# Patient Record
Sex: Female | Born: 1935 | Race: White | Hispanic: No | Marital: Single | Smoking: Never smoker
Health system: Southern US, Community
[De-identification: ages and names within clinical notes are randomized; demographics above are authoritative.]

## PROBLEM LIST (undated history)

## (undated) DIAGNOSIS — S72009A Fracture of unspecified part of neck of unspecified femur, initial encounter for closed fracture: Secondary | ICD-10-CM

## (undated) DIAGNOSIS — L0390 Cellulitis, unspecified: Secondary | ICD-10-CM

## (undated) DIAGNOSIS — I82402 Acute embolism and thrombosis of unspecified deep veins of left lower extremity: Secondary | ICD-10-CM

## (undated) DIAGNOSIS — F0390 Unspecified dementia without behavioral disturbance: Secondary | ICD-10-CM

## (undated) DIAGNOSIS — I10 Essential (primary) hypertension: Secondary | ICD-10-CM

## (undated) DIAGNOSIS — M1990 Unspecified osteoarthritis, unspecified site: Secondary | ICD-10-CM

## (undated) DIAGNOSIS — D649 Anemia, unspecified: Secondary | ICD-10-CM

## (undated) DIAGNOSIS — S72002A Fracture of unspecified part of neck of left femur, initial encounter for closed fracture: Secondary | ICD-10-CM

## (undated) DIAGNOSIS — G4700 Insomnia, unspecified: Secondary | ICD-10-CM

## (undated) DIAGNOSIS — T148XXA Other injury of unspecified body region, initial encounter: Secondary | ICD-10-CM

## (undated) DIAGNOSIS — H6690 Otitis media, unspecified, unspecified ear: Secondary | ICD-10-CM

## (undated) DIAGNOSIS — F419 Anxiety disorder, unspecified: Secondary | ICD-10-CM

## (undated) HISTORY — DX: Anxiety disorder, unspecified: F41.9

## (undated) HISTORY — PX: ABDOMINAL HYSTERECTOMY: SHX81

## (undated) HISTORY — DX: Insomnia, unspecified: G47.00

## (undated) HISTORY — DX: Unspecified osteoarthritis, unspecified site: M19.90

## (undated) HISTORY — PX: HIP ARTHROTOMY: SHX982

---

## 2001-11-22 ENCOUNTER — Emergency Department (HOSPITAL_COMMUNITY): Admission: EM | Admit: 2001-11-22 | Discharge: 2001-11-22 | Payer: Self-pay | Admitting: Emergency Medicine

## 2004-02-01 ENCOUNTER — Other Ambulatory Visit: Admission: RE | Admit: 2004-02-01 | Discharge: 2004-02-01 | Payer: Self-pay | Admitting: Dermatology

## 2004-06-18 ENCOUNTER — Emergency Department (HOSPITAL_COMMUNITY): Admission: EM | Admit: 2004-06-18 | Discharge: 2004-06-18 | Payer: Self-pay | Admitting: Emergency Medicine

## 2004-07-04 ENCOUNTER — Emergency Department (HOSPITAL_COMMUNITY): Admission: EM | Admit: 2004-07-04 | Discharge: 2004-07-04 | Payer: Self-pay | Admitting: Emergency Medicine

## 2004-07-05 ENCOUNTER — Emergency Department (HOSPITAL_COMMUNITY): Admission: EM | Admit: 2004-07-05 | Discharge: 2004-07-06 | Payer: Self-pay | Admitting: *Deleted

## 2004-08-05 ENCOUNTER — Ambulatory Visit (HOSPITAL_COMMUNITY): Admission: RE | Admit: 2004-08-05 | Discharge: 2004-08-05 | Payer: Self-pay | Admitting: Ophthalmology

## 2004-11-07 ENCOUNTER — Ambulatory Visit: Payer: Self-pay | Admitting: Orthopedic Surgery

## 2005-01-02 ENCOUNTER — Ambulatory Visit: Payer: Self-pay | Admitting: Orthopedic Surgery

## 2005-04-28 ENCOUNTER — Ambulatory Visit (HOSPITAL_COMMUNITY): Admission: RE | Admit: 2005-04-28 | Discharge: 2005-04-28 | Payer: Self-pay | Admitting: Ophthalmology

## 2006-03-20 ENCOUNTER — Emergency Department (HOSPITAL_COMMUNITY): Admission: EM | Admit: 2006-03-20 | Discharge: 2006-03-20 | Payer: Self-pay | Admitting: Emergency Medicine

## 2009-07-08 ENCOUNTER — Emergency Department (HOSPITAL_COMMUNITY): Admission: EM | Admit: 2009-07-08 | Discharge: 2009-07-08 | Payer: Self-pay | Admitting: Emergency Medicine

## 2009-09-29 DIAGNOSIS — T148XXA Other injury of unspecified body region, initial encounter: Secondary | ICD-10-CM

## 2009-09-29 HISTORY — DX: Other injury of unspecified body region, initial encounter: T14.8XXA

## 2010-08-17 ENCOUNTER — Emergency Department (HOSPITAL_COMMUNITY): Admission: EM | Admit: 2010-08-17 | Discharge: 2010-08-17 | Payer: Self-pay | Admitting: Emergency Medicine

## 2010-12-10 LAB — CBC
HCT: 32.2 % — ABNORMAL LOW (ref 36.0–46.0)
Hemoglobin: 10.7 g/dL — ABNORMAL LOW (ref 12.0–15.0)
MCH: 30.3 pg (ref 26.0–34.0)
MCHC: 33.3 g/dL (ref 30.0–36.0)
MCV: 90.9 fL (ref 78.0–100.0)
Platelets: 241 10*3/uL (ref 150–400)
RBC: 3.54 MIL/uL — ABNORMAL LOW (ref 3.87–5.11)
RDW: 13.9 % (ref 11.5–15.5)
WBC: 6.9 10*3/uL (ref 4.0–10.5)

## 2010-12-10 LAB — URINE CULTURE
Colony Count: NO GROWTH
Culture  Setup Time: 201111192203
Culture: NO GROWTH

## 2010-12-10 LAB — DIFFERENTIAL
Basophils Absolute: 0.1 10*3/uL (ref 0.0–0.1)
Basophils Relative: 1 % (ref 0–1)
Eosinophils Absolute: 0.1 10*3/uL (ref 0.0–0.7)
Eosinophils Relative: 1 % (ref 0–5)
Lymphocytes Relative: 28 % (ref 12–46)
Lymphs Abs: 1.9 10*3/uL (ref 0.7–4.0)
Monocytes Absolute: 0.6 10*3/uL (ref 0.1–1.0)
Monocytes Relative: 8 % (ref 3–12)
Neutro Abs: 4.3 10*3/uL (ref 1.7–7.7)
Neutrophils Relative %: 62 % (ref 43–77)

## 2010-12-10 LAB — URINE MICROSCOPIC-ADD ON

## 2010-12-10 LAB — BASIC METABOLIC PANEL
BUN: 20 mg/dL (ref 6–23)
CO2: 27 mEq/L (ref 19–32)
Calcium: 9 mg/dL (ref 8.4–10.5)
Chloride: 103 mEq/L (ref 96–112)
Creatinine, Ser: 0.92 mg/dL (ref 0.4–1.2)
GFR calc Af Amer: >60 mL/min (ref >60)
GFR calc non Af Amer: 57 mL/min — ABNORMAL LOW (ref >60)
Glucose, Bld: 92 mg/dL (ref 70–99)
Potassium: 4.7 mEq/L (ref 3.5–5.1)
Sodium: 136 mEq/L (ref 135–145)

## 2010-12-10 LAB — URINALYSIS, ROUTINE W REFLEX MICROSCOPIC
Bilirubin Urine: NEGATIVE
Glucose, UA: NEGATIVE mg/dL
Ketones, ur: NEGATIVE mg/dL
Leukocytes, UA: NEGATIVE
Nitrite: NEGATIVE
Protein, ur: NEGATIVE mg/dL
Specific Gravity, Urine: 1.01 (ref 1.005–1.030)
Urobilinogen, UA: 0.2 mg/dL (ref 0.0–1.0)
pH: 7.5 (ref 5.0–8.0)

## 2011-01-02 LAB — URINALYSIS, ROUTINE W REFLEX MICROSCOPIC
Bilirubin Urine: NEGATIVE
Glucose, UA: NEGATIVE mg/dL
Hgb urine dipstick: NEGATIVE
Ketones, ur: NEGATIVE mg/dL
Leukocytes, UA: NEGATIVE
Nitrite: POSITIVE — AB
Protein, ur: NEGATIVE mg/dL
Specific Gravity, Urine: 1.01 (ref 1.005–1.030)
Urobilinogen, UA: 0.2 mg/dL (ref 0.0–1.0)
pH: 6.5 (ref 5.0–8.0)

## 2011-01-02 LAB — URINE MICROSCOPIC-ADD ON

## 2011-02-14 NOTE — H&P (Signed)
NAME:  Wendy Coleman, Wendy Coleman NO.:  [id number]   MEDICAL RECORD NO.:  [id number]           PATIENT TYPE:   LOCATION:                                 FACILITY:   PHYSICIAN:  Donna Bernard, M.D.     DATE OF BIRTH:   DATE OF ADMISSION:  DATE OF DISCHARGE:  LH                                HISTORY & PHYSICAL   EMERGENCY ROOM VISIT   CHIEF COMPLAINT:  Shortness of breath.   SUBJECTIVE:  This patient is a 77 year old patient of our practice who  presents to the emergency room with shortness of breath.  She called me this  morning at 5:30 a.m. She is also a member of our church and friend of the  family.  She has been in the emergency room, now, for 3 times in the last  several weeks for dyspnea and shortness of breath.  Unfortunately she  recently failed her drivers' exam and this is a tremendous source of  independence for her.  She has been having periods of shortness of breath.  This is accompanied at times by slight diaphoresis. She has had no pain.  She has had a lot of difficulty sleeping at night.  There are others that  are sick at home.  No abdominal discomfort, no headache, no chest pain.  She  has had multiple tests including multiple EKGs, blood work, chest x-rays,  etcetera; all within normal limits.  The patient is currently on 1 medicine:  Atenolol 50 mg daily for blood pressure and drops for glaucoma.   ALLERGIES:  None known.   FAMILY HISTORY:  Not contributory.   REVIEW OF SYSTEMS:  Otherwise negative.   PHYSICAL EXAMINATION:  VITAL SIGNS:  Blood pressure 152/70.  GENERAL:  Alert, anxious appearing.  No other acute distress.  HEENT:  Normal.  NECK:  Supple.  LUNGS:  Clear.  Occasional sighing during breathing.  HEART:  Regular rate and rhythm.  ABDOMEN:  Soft and nontender.   SIGNIFICANT LABS:  Blood gases pCO2 of 27, pO2 of 89, pH 4.51.   IMPRESSION:  Dyspnea caused by hyperventilation and anxiety due to recent  stress. There is also sick  members within the family.  I have offered  hospitalization for a day or two, observation, but the patient realizes that  she could probably do just as well at home. Of note, she has tried low-dose  of Xanax the last 2 weeks without much success.   PLAN:  1.  Initiate Celexa 10 mg 1 each morning.  2.  Ambien 5 mg q.h.s. p.r.n.  3.  Follow up in the office next week.  4.  The patient may use paper bag, as needed, for hyperventilation spells.       ___________________________________________  Lacretia Nicks. Simone Curia, M.D.    Karie Chimera  D:  07/05/2004  T:  07/05/2004  Job:  [id number]

## 2011-02-14 NOTE — Procedures (Signed)
NAME:  Wendy Coleman, BASARA NO.:  [id number]   MEDICAL RECORD NO.:  [id number]          PATIENT TYPE:  EMS   LOCATION:  ED                            FACILITY:  [facility]   PHYSICIAN:  Edward L. Juanetta Gosling, M.D.DATE OF BIRTH:  March 07, 1911   DATE OF PROCEDURE:  DATE OF DISCHARGE:  03/20/2006                                EKG INTERPRETATION   Time 12:52, March 20, 2006. The rhythm is sinus rhythm with a rate at 50s.  There is diffuse ST elevation which could be due to early repolarization  that would be a bit unusual in a 77 year old, so clinical correlation is  suggested.  Abnormal electrocardiogram.      Oneal Deputy. Juanetta Gosling, M.D.  Electronically Signed     [facility]/MED[facility]  D:  03/21/2006  T:  03/21/2006  Job:  [id number]

## 2011-05-21 ENCOUNTER — Inpatient Hospital Stay (HOSPITAL_COMMUNITY)
Admission: EM | Admit: 2011-05-21 | Discharge: 2011-05-27 | DRG: 482 | Disposition: A | Discharge status: Nursing Home (Includes ICF) | Payer: Medicare Other | Attending: Orthopedic Surgery | Admitting: Orthopedic Surgery

## 2011-05-21 ENCOUNTER — Emergency Department (HOSPITAL_COMMUNITY): Payer: Medicare Other

## 2011-05-21 ENCOUNTER — Other Ambulatory Visit: Payer: Self-pay

## 2011-05-21 DIAGNOSIS — S72143A Displaced intertrochanteric fracture of unspecified femur, initial encounter for closed fracture: Principal | ICD-10-CM | POA: Diagnosis present

## 2011-05-21 DIAGNOSIS — Z66 Do not resuscitate: Secondary | ICD-10-CM | POA: Diagnosis present

## 2011-05-21 DIAGNOSIS — L89609 Pressure ulcer of unspecified heel, unspecified stage: Secondary | ICD-10-CM | POA: Clinically undetermined

## 2011-05-21 DIAGNOSIS — W010XXA Fall on same level from slipping, tripping and stumbling without subsequent striking against object, initial encounter: Secondary | ICD-10-CM | POA: Diagnosis present

## 2011-05-21 DIAGNOSIS — L8992 Pressure ulcer of unspecified site, stage 2: Secondary | ICD-10-CM | POA: Diagnosis not present

## 2011-05-21 DIAGNOSIS — S72002A Fracture of unspecified part of neck of left femur, initial encounter for closed fracture: Secondary | ICD-10-CM

## 2011-05-21 DIAGNOSIS — F0390 Unspecified dementia without behavioral disturbance: Secondary | ICD-10-CM | POA: Diagnosis present

## 2011-05-21 DIAGNOSIS — I10 Essential (primary) hypertension: Secondary | ICD-10-CM | POA: Diagnosis present

## 2011-05-21 DIAGNOSIS — Y9210 Unspecified residential institution as the place of occurrence of the external cause: Secondary | ICD-10-CM | POA: Diagnosis present

## 2011-05-21 HISTORY — DX: Essential (primary) hypertension: I10

## 2011-05-21 LAB — URINALYSIS, ROUTINE W REFLEX MICROSCOPIC
Bilirubin Urine: NEGATIVE
Glucose, UA: NEGATIVE mg/dL
Hgb urine dipstick: NEGATIVE
Ketones, ur: NEGATIVE mg/dL
Leukocytes, UA: NEGATIVE
Nitrite: NEGATIVE
Protein, ur: NEGATIVE mg/dL
Specific Gravity, Urine: 1.025 (ref 1.005–1.030)
Urobilinogen, UA: 0.2 mg/dL (ref 0.0–1.0)
pH: 5.5 (ref 5.0–8.0)

## 2011-05-21 LAB — CBC
HCT: 33.9 % — ABNORMAL LOW (ref 36.0–46.0)
Hemoglobin: 10.9 g/dL — ABNORMAL LOW (ref 12.0–15.0)
MCH: 30 pg (ref 26.0–34.0)
MCHC: 32.2 g/dL (ref 30.0–36.0)
MCV: 93.4 fL (ref 78.0–100.0)
Platelets: 258 10*3/uL (ref 150–400)
RBC: 3.63 MIL/uL — ABNORMAL LOW (ref 3.87–5.11)
RDW: 14.3 % (ref 11.5–15.5)
WBC: 7.2 10*3/uL (ref 4.0–10.5)

## 2011-05-21 LAB — DIFFERENTIAL
Basophils Absolute: 0 10*3/uL (ref 0.0–0.1)
Basophils Relative: 0 % (ref 0–1)
Eosinophils Absolute: 0.1 10*3/uL (ref 0.0–0.7)
Eosinophils Relative: 1 % (ref 0–5)
Lymphocytes Relative: 30 % (ref 12–46)
Lymphs Abs: 2.2 10*3/uL (ref 0.7–4.0)
Monocytes Absolute: 0.7 10*3/uL (ref 0.1–1.0)
Monocytes Relative: 9 % (ref 3–12)
Neutro Abs: 4.3 10*3/uL (ref 1.7–7.7)
Neutrophils Relative %: 59 % (ref 43–77)

## 2011-05-21 LAB — BASIC METABOLIC PANEL
BUN: 23 mg/dL (ref 6–23)
CO2: 29 mEq/L (ref 19–32)
Calcium: 9.4 mg/dL (ref 8.4–10.5)
Chloride: 104 mEq/L (ref 96–112)
Creatinine, Ser: 0.72 mg/dL (ref 0.50–1.10)
GFR calc Af Amer: >60 mL/min (ref >60)
GFR calc non Af Amer: >60 mL/min (ref >60)
Glucose, Bld: 102 mg/dL — ABNORMAL HIGH (ref 70–99)
Potassium: 4 mEq/L (ref 3.5–5.1)
Sodium: 140 mEq/L (ref 135–145)

## 2011-05-21 LAB — PROTIME-INR
INR: 0.96 (ref 0.00–1.49)
Prothrombin Time: 13 seconds (ref 11.6–15.2)

## 2011-05-21 LAB — APTT: aPTT: 32 seconds (ref 24–37)

## 2011-05-21 MED ORDER — MORPHINE SULFATE 4 MG/ML IJ SOLN
4.0000 mg | Freq: Once | INTRAMUSCULAR | Status: AC
  Administered 2011-05-21: 4 mg via INTRAVENOUS

## 2011-05-21 MED ORDER — METHOCARBAMOL 100 MG/ML IJ SOLN
500.0000 mg | Freq: Four times a day (QID) | INTRAMUSCULAR | Status: DC | PRN
  Filled 2011-05-21: qty 5

## 2011-05-21 MED ORDER — LISINOPRIL 5 MG PO TABS
5.0000 mg | ORAL_TABLET | ORAL | Status: DC
  Administered 2011-05-24 – 2011-05-25 (×2): 5 mg via ORAL
  Filled 2011-05-21 (×2): qty 1

## 2011-05-21 MED ORDER — HYDROMORPHONE HCL 1 MG/ML IJ SOLN: 0.5000 mg | INTRAMUSCULAR | Status: DC | PRN

## 2011-05-21 MED ORDER — ONDANSETRON HCL 4 MG/2ML IJ SOLN
INTRAMUSCULAR | Status: AC
  Administered 2011-05-21: 4 mg
  Filled 2011-05-21: qty 2

## 2011-05-21 MED ORDER — CITALOPRAM HYDROBROMIDE 20 MG PO TABS
10.0000 mg | ORAL_TABLET | ORAL | Status: DC
  Administered 2011-05-24: 08:00:00 via ORAL
  Administered 2011-05-25: 10 mg via ORAL
  Filled 2011-05-21 (×2): qty 1

## 2011-05-21 MED ORDER — HYDROMORPHONE HCL 1 MG/ML IJ SOLN
0.5000 mg | INTRAMUSCULAR | Status: DC | PRN
  Administered 2011-05-22: 0.5 mg via INTRAVENOUS
  Filled 2011-05-21 (×3): qty 1

## 2011-05-21 MED ORDER — DOCUSATE SODIUM 100 MG PO CAPS
100.0000 mg | ORAL_CAPSULE | Freq: Every day | ORAL | Status: DC
  Administered 2011-05-25 – 2011-05-26 (×2): 100 mg via ORAL
  Filled 2011-05-21 (×3): qty 1

## 2011-05-21 MED ORDER — BIMATOPROST 0.01 % OP SOLN
1.0000 [drp] | Freq: Every day | OPHTHALMIC | Status: DC
  Administered 2011-05-24: 1 [drp] via OPHTHALMIC
  Filled 2011-05-21: qty 5

## 2011-05-21 MED ORDER — POLYETHYLENE GLYCOL 3350 17 G PO PACK
17.0000 g | PACK | Freq: Every day | ORAL | Status: DC
  Administered 2011-05-23: 17 g via ORAL
  Administered 2011-05-25 – 2011-05-26 (×2): via ORAL
  Filled 2011-05-21 (×2): qty 1

## 2011-05-21 MED ORDER — OLANZAPINE 5 MG PO TABS
2.5000 mg | ORAL_TABLET | Freq: Every day | ORAL | Status: DC
  Administered 2011-05-24 – 2011-05-26 (×4): 2.5 mg via ORAL
  Filled 2011-05-21 (×5): qty 1

## 2011-05-21 MED ORDER — HYDROCODONE-ACETAMINOPHEN 5-325 MG PO TABS: 1.0000 | ORAL_TABLET | ORAL | Status: DC | PRN

## 2011-05-21 MED ORDER — METHOCARBAMOL 500 MG PO TABS: 500.0000 mg | ORAL_TABLET | Freq: Four times a day (QID) | ORAL | Status: DC | PRN

## 2011-05-21 MED ORDER — MORPHINE SULFATE 4 MG/ML IJ SOLN
2.0000 mg | Freq: Once | INTRAMUSCULAR | Status: AC
  Administered 2011-05-21: 2 mg via INTRAVENOUS
  Filled 2011-05-21: qty 1

## 2011-05-21 MED ORDER — HYDROMORPHONE HCL 1 MG/ML IJ SOLN
0.5000 mg | INTRAMUSCULAR | Status: DC | PRN
  Administered 2011-05-23: 1 mg via INTRAVENOUS

## 2011-05-21 MED ORDER — MORPHINE SULFATE 4 MG/ML IJ SOLN
4.0000 mg | Freq: Once | INTRAMUSCULAR | Status: DC
  Filled 2011-05-21: qty 1

## 2011-05-21 MED ORDER — ENOXAPARIN SODIUM 40 MG/0.4ML ~~LOC~~ SOLN: 40.0000 mg | SUBCUTANEOUS | Status: AC

## 2011-05-21 NOTE — ED Notes (Signed)
Presents from [facility] s/p fall; c/o left hip pain; LLE externally rotated and shortened; left pedal pulse present, assessed with doppler per EDP.

## 2011-05-21 NOTE — ED Provider Notes (Signed)
Scribed for Dr. Fredricka Bonine, the patient was seen in room 18. This chart was scribed by Hillery Hunter. This patient's care was started at 16:08.  History     CSN: [id number] Arrival date & time: 05/21/2011  3:44 PM  Chief Complaint  Patient presents with  . Fall   The history is provided by the patient.    Wendy Coleman is a 77 y.o. female who presents to the Emergency Department brought in by EMS complaining of fall just prior to arrival. She states she was trying to pick something up off the ground when she had a mechanical fall onto her left hip and left arm and now has pain with flexion of the left shoulder. She reports prior fracture of shoulder without surgical repair.   Past Medical History  Diagnosis Date  . Hypertension     History reviewed. No pertinent past surgical history.  History reviewed. No pertinent family history.  History  Substance Use Topics  . Smoking status: Not on file  . Smokeless tobacco: Not on file  . Alcohol Use: No    Review of Systems  Respiratory: Negative for shortness of breath.   Cardiovascular: Negative for chest pain.  Gastrointestinal: Negative for nausea and abdominal pain.  Musculoskeletal: Negative for back pain.  Neurological: Negative for dizziness, syncope, speech difficulty, weakness and numbness.  Psychiatric/Behavioral: Negative for confusion.  All other systems reviewed and are negative.    Physical Exam  BP 174/81  Pulse 87  Temp(Src) 97.8 F (36.6 C) (Oral)  Resp 19  SpO2 100%  Physical Exam  Nursing note and vitals reviewed. Constitutional:       Awake, alert, nontoxic appearance with baseline speech for patient.  HENT:  Head: Normocephalic and atraumatic.  Mouth/Throat: Oropharynx is clear and moist.  Eyes: EOM are normal. Pupils are equal, round, and reactive to light. Right eye exhibits no discharge. Left eye exhibits no discharge.  Neck: Neck supple.  Cardiovascular: Normal rate, regular rhythm  and normal heart sounds.   No murmur heard.      Biphasic wave form heard on doppler ultrasound of the left DP/PT pulses  Pulmonary/Chest: Effort normal and breath sounds normal. No stridor. No respiratory distress. She has no wheezes. She has no rales. She exhibits no tenderness.  Abdominal: Soft. Bowel sounds are normal. She exhibits no mass. There is no tenderness. There is no rebound.  Musculoskeletal: She exhibits no tenderness.       Baseline ROM, moves extremities with no obvious new focal weakness. Left lower extremity is externally rotated. No pain through lower leg, tender through left thigh, pain with movement at left hip. Pulse in distal left lower extremity diminished but present, compared to the right side. Left shoulder does not show any instability or deformity. No tenderness to palpation, but painful with flexion. Pelvis stable  Lymphadenopathy:    She has no cervical adenopathy.  Neurological:       Awake, alert, cooperative and aware of situation; motor strength bilaterally; sensation normal to light touch bilaterally  Skin: Skin is warm and dry.  Psychiatric: She has a normal mood and affect.    ED Course  Procedures  OTHER DATA REVIEWED: Nursing notes, vital signs, and past medical records reviewed.    DIAGNOSTIC STUDIES: Oxygen Saturation is 100% on room air, normal by my interpretation.     Date: 05/21/2011  Rate: 69  Rhythm: normal sinus rhythm and premature ventricular contractions (PVC)  QRS Axis: normal  Intervals: normal  ST/T Wave abnormalities: normal  Conduction Disutrbances:none  Old EKG Reviewed: none available    LABS / RADIOLOGY:  Results for orders placed during the hospital encounter of 05/21/11  CBC      Component Value Range   WBC 7.2  4.0 - 10.5 (K/uL)   RBC 3.63 (*) 3.87 - 5.11 (MIL/uL)   Hemoglobin 10.9 (*) 12.0 - 15.0 (g/dL)   HCT 16.1 (*) 09.6 - 46.0 (%)   MCV 93.4  78.0 - 100.0 (fL)   MCH 30.0  26.0 - 34.0 (pg)   MCHC 32.2   30.0 - 36.0 (g/dL)   RDW 04.5  40.9 - 81.1 (%)   Platelets 258  150 - 400 (K/uL)  DIFFERENTIAL      Component Value Range   Neutrophils Relative 59  43 - 77 (%)   Neutro Abs 4.3  1.7 - 7.7 (K/uL)   Lymphocytes Relative 30  12 - 46 (%)   Lymphs Abs 2.2  0.7 - 4.0 (K/uL)   Monocytes Relative 9  3 - 12 (%)   Monocytes Absolute 0.7  0.1 - 1.0 (K/uL)   Eosinophils Relative 1  0 - 5 (%)   Eosinophils Absolute 0.1  0.0 - 0.7 (K/uL)   Basophils Relative 0  0 - 1 (%)   Basophils Absolute 0.0  0.0 - 0.1 (K/uL)  BASIC METABOLIC PANEL      Component Value Range   Sodium 140  135 - 145 (mEq/L)   Potassium 4.0  3.5 - 5.1 (mEq/L)   Chloride 104  96 - 112 (mEq/L)   CO2 29  19 - 32 (mEq/L)   Glucose, Bld 102 (*) 70 - 99 (mg/dL)   BUN 23  6 - 23 (mg/dL)   Creatinine, Ser 9.14  0.50 - 1.10 (mg/dL)   Calcium 9.4  8.4 - 78.2 (mg/dL)   GFR calc non Af Amer >60  >60 (mL/min)   GFR calc Af Amer >60  >60 (mL/min)  PROTIME-INR      Component Value Range   Prothrombin Time 13.0  11.6 - 15.2 (seconds)   INR 0.96  0.00 - 1.49   APTT      Component Value Range   aPTT 32  24 - 37 (seconds)  URINALYSIS, ROUTINE W REFLEX MICROSCOPIC      Component Value Range   Color, Urine YELLOW  YELLOW    Appearance CLEAR  CLEAR    Specific Gravity, Urine 1.025  1.005 - 1.030    pH 5.5  5.0 - 8.0    Glucose, UA NEGATIVE  NEGATIVE (mg/dL)   Hgb urine dipstick NEGATIVE  NEGATIVE    Bilirubin Urine NEGATIVE  NEGATIVE    Ketones, ur NEGATIVE  NEGATIVE (mg/dL)   Protein, ur NEGATIVE  NEGATIVE (mg/dL)   Urobilinogen, UA 0.2  0.0 - 1.0 (mg/dL)   Nitrite NEGATIVE  NEGATIVE    Leukocytes, UA NEGATIVE  NEGATIVE      CHEST - 1 VIEW Comparison: 08/17/2010 IMPRESSION: Chronic bronchitic and old granulomatous disease changes. Enlargement of cardiac silhouette. No acute abnormalities.    LEFT SHOULDER - 2+ VIEW IMPRESSION: Advanced left glenohumeral degenerative changes of osseous demineralization. No definite  acute left shoulder abnormalities.    LEFT HIP - COMPLETE 2+ VIEW IMPRESSION: Displaced mildly angulated intertrochanteric fracture left femur.  Original Report Authenticated By: Lollie Marrow, M.D.   ED COURSE / COORDINATION OF CARE: 16:23. Initial orders: CBC, differential, BMP, Protime-INR, APTT, CXR, XR left HIP, XR left shoulder,  U/A with micro, Morphine 4mg .    MDM:  DDx: left shoulder fracture, left hip fracture, left femur fracture   PLAN:  The patient and the x-ray findings were discussed with Dr. Romeo Apple, the on-call orthopedic surgeon, who will admit the patient to the hospital for further evaluation and treatment of her fracture. I've inform the patient and her family of the findings and treatment plan.    MEDICATIONS GIVEN IN THE E.D.  Medications  ondansetron (ZOFRAN) 4 MG/2ML injection (4 mg  Given 05/21/11 1634)  morphine injection 4 mg (4 mg Intravenous Given 05/21/11 1634)    Scribe Attestation I personally performed the services described in this documentation, which was scribed in my presence. The recorded information has been reviewed and considered.   Felisa Bonier, MD 05/21/11 [phone]

## 2011-05-21 NOTE — ED Notes (Addendum)
Reports left hip pain returning, states "it just hurts" when asked to rate 0/10. EDP notified.

## 2011-05-21 NOTE — ED Notes (Signed)
Per ems, pt tripped over an electrica cord and fell. Complain of pain in left hip and shoulder

## 2011-05-22 LAB — URINE CULTURE
Colony Count: NO GROWTH
Culture  Setup Time: 201208221820
Culture: NO GROWTH

## 2011-05-22 LAB — TYPE AND SCREEN: Antibody Screen: NEGATIVE

## 2011-05-22 NOTE — H&P (Signed)
Wendy Coleman is an 77 y.o. female.   Chief Complaint: fractured hip HPI: 62 lives at [facility] uses a walker very healthy for age, active for age fell injured hip. Can not ambulate.  She has a POA that explained the surgery to.  Informed consent was done with her including diagrams.    Past Medical History  Diagnosis Date  . Hypertension     History reviewed. No pertinent past surgical history.  History reviewed. No pertinent family history. Social History:  reports that she has never smoked. She has never used smokeless tobacco. She reports that she does not drink alcohol or use illicit drugs.  Allergies: No Known Allergies  Medications Prior to Admission  Medication Dose Route Frequency Provider Last Rate Last Dose  . bimatoprost (LUMIGAN) 0.01 % ophthalmic solution 1 drop  1 drop Both Eyes QHS Fuller Canada, MD      . citalopram (CELEXA) tablet 10 mg  10 mg Oral QAM Fuller Canada, MD      . docusate sodium (COLACE) capsule 100 mg  100 mg Oral Daily Fuller Canada, MD      . enoxaparin (LOVENOX) injection 40 mg  40 mg Subcutaneous Q24H Fuller Canada, MD      . HYDROcodone-acetaminophen [facility]) 5-325 MG per tablet 1-2 tablet  1-2 tablet Oral Q4H PRN Fuller Canada, MD      . HYDROmorphone (DILAUDID) injection 0.5 mg  0.5 mg Intravenous Q2H PRN Fuller Canada, MD   0.5 mg at 05/22/11 0708  . HYDROmorphone (DILAUDID) injection 0.5-1 mg  0.5-1 mg Intravenous Q2H PRN Fuller Canada, MD      . lisinopril (PRINIVIL,ZESTRIL) tablet 5 mg  5 mg Oral QAM Fuller Canada, MD      . methocarbamol (ROBAXIN) tablet 500 mg  500 mg Oral Q6H PRN Fuller Canada, MD       Or  . methocarbamol (ROBAXIN) 500 mg in dextrose 5 % 50 mL IVPB  500 mg Intravenous Q6H PRN Fuller Canada, MD      . morphine injection 2 mg  2 mg Intravenous Once Felisa Bonier, MD   2 mg at 05/21/11 1754  . morphine injection 4 mg  4 mg Intravenous Once Felisa Bonier, MD   4 mg at 05/21/11 1634    . OLANZapine (ZYPREXA) tablet 2.5 mg  2.5 mg Oral QHS Fuller Canada, MD      . ondansetron [facility]) 4 MG/2ML injection        4 mg at 05/21/11 1634  . polyethylene glycol (MIRALAX / GLYCOLAX) packet 17 g  17 g Oral Daily Fuller Canada, MD      . DISCONTD: HYDROmorphone (DILAUDID) injection 0.5-1 mg  0.5-1 mg Intravenous Q2H PRN Fuller Canada, MD      . DISCONTD: morphine injection 4 mg  4 mg Intramuscular Once Felisa Bonier, MD       No current outpatient prescriptions on file as of 05/22/2011.    Results for orders placed during the hospital encounter of 05/21/11 (from the past 48 hour(s))  CBC     Status: Abnormal   Collection Time   05/21/11  4:18 PM      Component Value Range Comment   WBC 7.2  4.0 - 10.5 (K/uL)    RBC 3.63 (*) 3.87 - 5.11 (MIL/uL)    Hemoglobin 10.9 (*) 12.0 - 15.0 (g/dL)    HCT 16.1 (*) 09.6 - 46.0 (%)    MCV 93.4  78.0 - 100.0 (fL)  MCH 30.0  26.0 - 34.0 (pg)    MCHC 32.2  30.0 - 36.0 (g/dL)    RDW 16.1  09.6 - 04.5 (%)    Platelets 258  150 - 400 (K/uL)   DIFFERENTIAL     Status: Normal   Collection Time   05/21/11  4:18 PM      Component Value Range Comment   Neutrophils Relative 59  43 - 77 (%)    Neutro Abs 4.3  1.7 - 7.7 (K/uL)    Lymphocytes Relative 30  12 - 46 (%)    Lymphs Abs 2.2  0.7 - 4.0 (K/uL)    Monocytes Relative 9  3 - 12 (%)    Monocytes Absolute 0.7  0.1 - 1.0 (K/uL)    Eosinophils Relative 1  0 - 5 (%)    Eosinophils Absolute 0.1  0.0 - 0.7 (K/uL)    Basophils Relative 0  0 - 1 (%)    Basophils Absolute 0.0  0.0 - 0.1 (K/uL)   BASIC METABOLIC PANEL     Status: Abnormal   Collection Time   05/21/11  4:18 PM      Component Value Range Comment   Sodium 140  135 - 145 (mEq/L)    Potassium 4.0  3.5 - 5.1 (mEq/L)    Chloride 104  96 - 112 (mEq/L)    CO2 29  19 - 32 (mEq/L)    Glucose, Bld 102 (*) 70 - 99 (mg/dL)    BUN 23  6 - 23 (mg/dL)    Creatinine, Ser 4.09  0.50 - 1.10 (mg/dL)    Calcium 9.4  8.4 - 10.5 (mg/dL)     GFR calc non Af Amer >60  >60 (mL/min)    GFR calc Af Amer >60  >60 (mL/min)   PROTIME-INR     Status: Normal   Collection Time   05/21/11  4:18 PM      Component Value Range Comment   Prothrombin Time 13.0  11.6 - 15.2 (seconds)    INR 0.96  0.00 - 1.49    APTT     Status: Normal   Collection Time   05/21/11  4:18 PM      Component Value Range Comment   aPTT 32  24 - 37 (seconds)   URINALYSIS, ROUTINE W REFLEX MICROSCOPIC     Status: Normal   Collection Time   05/21/11  4:23 PM      Component Value Range Comment   Color, Urine YELLOW  YELLOW     Appearance CLEAR  CLEAR     Specific Gravity, Urine 1.025  1.005 - 1.030     pH 5.5  5.0 - 8.0     Glucose, UA NEGATIVE  NEGATIVE (mg/dL)    Hgb urine dipstick NEGATIVE  NEGATIVE     Bilirubin Urine NEGATIVE  NEGATIVE     Ketones, ur NEGATIVE  NEGATIVE (mg/dL)    Protein, ur NEGATIVE  NEGATIVE (mg/dL)    Urobilinogen, UA 0.2  0.0 - 1.0 (mg/dL)    Nitrite NEGATIVE  NEGATIVE     Leukocytes, UA NEGATIVE  NEGATIVE  MICROSCOPIC NOT DONE ON URINES WITH NEGATIVE PROTEIN, BLOOD, LEUKOCYTES, NITRITE, OR GLUCOSE <1000 mg/dL.   Dg Chest 1 View  05/21/2011  *RADIOLOGY REPORT*  Clinical Data: Left shoulder and left hip pain post fall  CHEST - 1 VIEW  Comparison: 08/17/2010  Findings: Enlargement of cardiac silhouette. Atherosclerotic calcification aorta. Pulmonary vascularity mediastinal contours otherwise normal. Chronic bronchitic  changes. Calcified granuloma left mid lung. No acute infiltrate or effusion. Diffuse osseous demineralization. Advanced bilateral glenohumeral degenerative changes. Scattered end plate spur formation thoracolumbar spine. No definite acute bony abnormalities.  IMPRESSION: Chronic bronchitic and old granulomatous disease changes. Enlargement of cardiac silhouette. No acute abnormalities.  Original Report Authenticated By: Lollie Marrow, M.D.   Dg Hip Complete Left  05/21/2011  *RADIOLOGY REPORT*  Clinical Data: Left hip pain post  fall  LEFT HIP - COMPLETE 2+ VIEW  Comparison: None  Findings: Osseous demineralization. Intertrochanteric fracture left femur with mild varus angulation and displacement. No dislocation of the femoral head. Symmetric hip and SI joints. No pelvic fracture identified. Scattered atherosclerotic calcifications.  IMPRESSION: Displaced mildly angulated intertrochanteric fracture left femur.  Per CMS PQRS reporting requirements (PQRS Measure 24): Given the patient's age of greater than 50 and the fracture site (hip, distal radius, or spine), the patient should be tested for osteoporosis using DXA, and the appropriate treatment considered based on the DXA results.  Original Report Authenticated By: Lollie Marrow, M.D.   Dg Shoulder Left  05/21/2011  *RADIOLOGY REPORT*  Clinical Data: Left shoulder pain post fall  LEFT SHOULDER - 2+ VIEW  Comparison: None  Findings: Minimal AC joint degenerative changes. Diffuse osseous demineralization. Advanced glenohumeral degenerative changes left shoulder with bulky inferior spur formation. No definite acute fracture, dislocation, or bone destruction. Left ribs intact.  IMPRESSION: Advanced left glenohumeral degenerative changes of osseous demineralization. No definite acute left shoulder abnormalities.  Original Report Authenticated By: Lollie Marrow, M.D.    Review of Systems  Unable to perform ROS: dementia    Blood pressure 125/74, pulse 91, temperature 98.5 F (36.9 C), temperature source Oral, resp. rate 16, SpO2 97.00%. Physical Exam  Constitutional: She appears well-developed and well-nourished.  HENT:  Head: Normocephalic.  Eyes: Conjunctivae are normal.  Neck: Neck supple. No tracheal deviation present. No thyromegaly present.  Cardiovascular: Normal rate and regular rhythm.   Respiratory: Effort normal.  GI: Soft.  Musculoskeletal:       Right shoulder: She exhibits normal range of motion and no deformity.       Left shoulder: She exhibits decreased  range of motion and crepitus. She exhibits no deformity.       Right hip: Normal.       Left hip: She exhibits decreased range of motion, decreased strength, tenderness and deformity.       Right knee: Normal.       Left knee: Normal.       Right ankle: Normal.       Left ankle: Normal.  Neurological:       Sedated   Skin: Skin is warm and dry.  Psychiatric:       Sedated      Assessment/Plan 3 part intertrochanteric fracture lef thip  OTIF LEFT HIP WITH GAMMA NAIL   Fuller Canada 05/22/2011, 8:10 AM

## 2011-05-22 NOTE — Progress Notes (Signed)
Subjective: None ( non verbal)    Objective: Vital signs in last 24 hours: Temp:  [97.6 F (36.4 C)-98.5 F (36.9 C)] 98.5 F (36.9 C) (08/23 0442) Pulse Rate:  [73-131] 91  (08/23 0442) Resp:  [16-20] 16  (08/23 0442) BP: (125-174)/(63-81) 125/74 mmHg (08/23 0442) SpO2:  [92 %-100 %] 97 % (08/23 0442)  Intake/Output from previous day:   Intake/Output this shift:     Basename 05/21/11 1618  HGB 10.9*    Basename 05/21/11 1618  WBC 7.2  RBC 3.63*  HCT 33.9*  PLT 258    Basename 05/21/11 1618  NA 140  K 4.0  CL 104  CO2 29  BUN 23  CREATININE 0.72  GLUCOSE 102*  CALCIUM 9.4    Basename 05/21/11 1618  LABPT --  INR 0.96    Neurovascular intact  Assessment/Plan: Surgery tomorrow; needs DNR form    Fuller Canada 05/22/2011, 8:08 AM

## 2011-05-22 NOTE — Progress Notes (Signed)
UR Chart Review Completed  

## 2011-05-23 ENCOUNTER — Encounter (HOSPITAL_COMMUNITY): Payer: Self-pay | Admitting: Anesthesiology

## 2011-05-23 ENCOUNTER — Encounter (HOSPITAL_COMMUNITY): Payer: Self-pay | Admitting: *Deleted

## 2011-05-23 ENCOUNTER — Inpatient Hospital Stay (HOSPITAL_COMMUNITY): Payer: Medicare Other

## 2011-05-23 ENCOUNTER — Inpatient Hospital Stay (HOSPITAL_COMMUNITY): Payer: Medicare Other | Admitting: Anesthesiology

## 2011-05-23 ENCOUNTER — Encounter (HOSPITAL_COMMUNITY)
Admission: EM | Disposition: A | Discharge status: Nursing Home (Includes ICF) | Payer: Self-pay | Source: Home / Self Care | Attending: Orthopedic Surgery

## 2011-05-23 DIAGNOSIS — S72009A Fracture of unspecified part of neck of unspecified femur, initial encounter for closed fracture: Secondary | ICD-10-CM

## 2011-05-23 HISTORY — PX: ORIF HIP FRACTURE: SHX2125

## 2011-05-23 SURGERY — OPEN REDUCTION INTERNAL FIXATION HIP
Anesthesia: Spinal | Laterality: Left | Wound class: Clean

## 2011-05-23 MED ORDER — ACETAMINOPHEN 325 MG PO TABS
325.0000 mg | ORAL_TABLET | ORAL | Status: DC | PRN
  Administered 2011-05-26: 325 mg via ORAL
  Administered 2011-05-27: 650 mg via ORAL
  Filled 2011-05-23 (×2): qty 2

## 2011-05-23 MED ORDER — PROPOFOL 10 MG/ML IV EMUL
INTRAVENOUS | Status: DC | PRN
  Administered 2011-05-23: 25 ug/kg/min via INTRAVENOUS

## 2011-05-23 MED ORDER — EPHEDRINE SULFATE 50 MG/ML IJ SOLN
INTRAMUSCULAR | Status: AC
  Filled 2011-05-23: qty 1

## 2011-05-23 MED ORDER — FENTANYL CITRATE 0.05 MG/ML IJ SOLN
INTRAMUSCULAR | Status: DC | PRN
  Administered 2011-05-23: 25 ug via INTRATHECAL

## 2011-05-23 MED ORDER — SODIUM CHLORIDE 0.9 % IJ SOLN
INTRAMUSCULAR | Status: AC
  Filled 2011-05-23: qty 3

## 2011-05-23 MED ORDER — MIDAZOLAM HCL 2 MG/2ML IJ SOLN
1.0000 mg | INTRAMUSCULAR | Status: DC | PRN
  Administered 2011-05-23: 2 mg via INTRAVENOUS

## 2011-05-23 MED ORDER — GLYCOPYRROLATE 0.2 MG/ML IJ SOLN
0.2000 mg | Freq: Once | INTRAMUSCULAR | Status: AC | PRN
  Administered 2011-05-23: 0.2 mg via INTRAVENOUS

## 2011-05-23 MED ORDER — FENTANYL CITRATE 0.05 MG/ML IJ SOLN
INTRAMUSCULAR | Status: AC
  Filled 2011-05-23: qty 2

## 2011-05-23 MED ORDER — LACTATED RINGERS IV SOLN
INTRAVENOUS | Status: DC
  Administered 2011-05-23 (×2): via INTRAVENOUS

## 2011-05-23 MED ORDER — CEFAZOLIN SODIUM 1-5 GM-% IV SOLN
1.0000 g | Freq: Four times a day (QID) | INTRAVENOUS | Status: AC
  Administered 2011-05-23 – 2011-05-24 (×3): 1 g via INTRAVENOUS
  Filled 2011-05-23 (×2): qty 50

## 2011-05-23 MED ORDER — ONDANSETRON HCL 4 MG/2ML IJ SOLN: 4.0000 mg | Freq: Once | INTRAMUSCULAR | Status: AC | PRN

## 2011-05-23 MED ORDER — PHENYLEPHRINE HCL 10 MG/ML IJ SOLN
INTRAMUSCULAR | Status: DC | PRN
  Administered 2011-05-23 (×5): 100 ug via INTRAVENOUS

## 2011-05-23 MED ORDER — FENTANYL CITRATE 0.05 MG/ML IJ SOLN: 25.0000 ug | INTRAMUSCULAR | Status: DC | PRN

## 2011-05-23 MED ORDER — CEFAZOLIN SODIUM 1-5 GM-% IV SOLN
INTRAVENOUS | Status: DC | PRN
  Administered 2011-05-23: 1 g via INTRAVENOUS

## 2011-05-23 MED ORDER — EPHEDRINE SULFATE 50 MG/ML IJ SOLN
INTRAMUSCULAR | Status: DC | PRN
  Administered 2011-05-23: 10 mg via INTRAVENOUS

## 2011-05-23 MED ORDER — PROPOFOL 10 MG/ML IV EMUL
INTRAVENOUS | Status: AC
  Filled 2011-05-23: qty 20

## 2011-05-23 MED ORDER — LACTATED RINGERS IV SOLN: INTRAVENOUS | Status: DC

## 2011-05-23 MED ORDER — BUPIVACAINE-EPINEPHRINE PF 0.5-1:200000 % IJ SOLN
INTRAMUSCULAR | Status: AC
  Filled 2011-05-23: qty 20

## 2011-05-23 MED ORDER — LIDOCAINE HCL (PF) 1 % IJ SOLN
INTRAMUSCULAR | Status: AC
  Filled 2011-05-23: qty 5

## 2011-05-23 MED ORDER — BUPIVACAINE HCL (PF) 0.5 % IJ SOLN
INTRAMUSCULAR | Status: DC | PRN
  Administered 2011-05-23: 11 mg

## 2011-05-23 MED ORDER — CEFAZOLIN SODIUM 1-5 GM-% IV SOLN
1.0000 g | Freq: Three times a day (TID) | INTRAVENOUS | Status: DC
  Administered 2011-05-23: 1 g via INTRAVENOUS
  Filled 2011-05-23 (×10): qty 50

## 2011-05-23 MED ORDER — BIMATOPROST 0.03 % OP SOLN
OPHTHALMIC | Status: AC
  Filled 2011-05-23: qty 2.5

## 2011-05-23 MED ORDER — CEFAZOLIN SODIUM 1-5 GM-% IV SOLN
INTRAVENOUS | Status: AC
  Administered 2011-05-23: 1 g via INTRAVENOUS
  Filled 2011-05-23: qty 50

## 2011-05-23 MED ORDER — BUPIVACAINE-EPINEPHRINE PF 0.5-1:200000 % IJ SOLN
INTRAMUSCULAR | Status: DC | PRN
  Administered 2011-05-23: 60 mL

## 2011-05-23 MED ORDER — BUPIVACAINE IN DEXTROSE 0.75-8.25 % IT SOLN
INTRATHECAL | Status: AC
  Filled 2011-05-23: qty 2

## 2011-05-23 MED ORDER — SODIUM CHLORIDE 0.9 % IR SOLN
Status: DC | PRN
  Administered 2011-05-23: 1000 mL

## 2011-05-23 SURGICAL SUPPLY — 59 items
BAG HAMPER (MISCELLANEOUS) ×2 IMPLANT
BANDAGE GAUZE ELAST BULKY 4 IN (GAUZE/BANDAGES/DRESSINGS) ×2 IMPLANT
BIT DRILL AO GAMMA 4.2X300 (BIT) ×2 IMPLANT
BLADE SURG SZ10 CARB STEEL (BLADE) ×4 IMPLANT
CHLORAPREP W/TINT 26ML (MISCELLANEOUS) ×2 IMPLANT
CLOTH BEACON ORANGE TIMEOUT ST (SAFETY) ×2 IMPLANT
COVER LIGHT HANDLE STERIS (MISCELLANEOUS) ×4 IMPLANT
COVER MAYO STAND XLG (DRAPE) ×2 IMPLANT
DECANTER SPIKE VIAL GLASS SM (MISCELLANEOUS) ×2 IMPLANT
DRAIN TROCAR  MED 1/8 (DRAIN) IMPLANT
DRAPE STERI IOBAN 125X83 (DRAPES) ×2 IMPLANT
DRSG MEPILEX BORDER 4X12 (GAUZE/BANDAGES/DRESSINGS) ×2 IMPLANT
ELECT REM PT RETURN 9FT ADLT (ELECTROSURGICAL) ×2
ELECTRODE REM PT RTRN 9FT ADLT (ELECTROSURGICAL) ×1 IMPLANT
GLOVE BIOGEL PI IND STRL 7.0 (GLOVE) ×2 IMPLANT
GLOVE BIOGEL PI IND STRL 7.5 (GLOVE) ×1 IMPLANT
GLOVE BIOGEL PI INDICATOR 7.0 (GLOVE) ×2
GLOVE BIOGEL PI INDICATOR 7.5 (GLOVE) ×1
GLOVE ECLIPSE 6.5 STRL STRAW (GLOVE) ×2 IMPLANT
GLOVE ECLIPSE 7.0 STRL STRAW (GLOVE) ×2 IMPLANT
GLOVE SKINSENSE NS SZ8.0 LF (GLOVE) ×1
GLOVE SKINSENSE STRL SZ8.0 LF (GLOVE) ×1 IMPLANT
GLOVE SS BIOGEL STRL SZ 6.5 (GLOVE) ×1 IMPLANT
GLOVE SS N UNI LF 8.5 STRL (GLOVE) ×4 IMPLANT
GLOVE SUPERSENSE BIOGEL SZ 6.5 (GLOVE) ×1
GOWN BRE IMP SLV AUR XL STRL (GOWN DISPOSABLE) ×4 IMPLANT
GOWN STRL REIN XL XLG (GOWN DISPOSABLE) ×2 IMPLANT
GUIDEROD T2 3X1000 (ROD) IMPLANT
GUIDEWIRE GAMMA 800 (WIRE) ×2 IMPLANT
INST SET MAJOR BONE (KITS) ×2 IMPLANT
K-WIRE  3.2X450M STR (WIRE) ×1
K-WIRE 3.2X450M STR (WIRE) ×1
KIT BLADEGUARD II DBL (SET/KITS/TRAYS/PACK) ×2 IMPLANT
KIT ROOM TURNOVER AP CYSTO (KITS) ×2 IMPLANT
KWIRE 3.2X450M STR (WIRE) ×1 IMPLANT
MANIFOLD NEPTUNE II (INSTRUMENTS) ×2 IMPLANT
MARKER SKIN DUAL TIP RULER LAB (MISCELLANEOUS) ×2 IMPLANT
NAIL TROCH GAMMA 11X18 (Nail) ×2 IMPLANT
NEEDLE HYPO 21X1.5 SAFETY (NEEDLE) ×2 IMPLANT
NEEDLE SPNL 18GX3.5 QUINCKE PK (NEEDLE) ×2 IMPLANT
NS IRRIG 1000ML POUR BTL (IV SOLUTION) ×2 IMPLANT
PACK BASIC III (CUSTOM PROCEDURE TRAY) ×1
PACK SRG BSC III STRL LF ECLPS (CUSTOM PROCEDURE TRAY) ×1 IMPLANT
PAD ABD 5X9 TENDERSORB (GAUZE/BANDAGES/DRESSINGS) IMPLANT
PAD CAST 4YDX4 CTTN HI CHSV (CAST SUPPLIES) IMPLANT
PADDING CAST COTTON 4X4 STRL (CAST SUPPLIES)
PENCIL HANDSWITCHING (ELECTRODE) ×2 IMPLANT
SCREW LAG GAMMA 3 95MM (Screw) ×2 IMPLANT
SCREW LOCKING T2 F/T  5MMX35MM (Screw) ×1 IMPLANT
SCREW LOCKING T2 F/T 5MMX35MM (Screw) ×1 IMPLANT
SET BASIN LINEN APH (SET/KITS/TRAYS/PACK) ×2 IMPLANT
SPONGE LAP 18X18 X RAY DECT (DISPOSABLE) ×4 IMPLANT
STAPLER VISISTAT 35W (STAPLE) ×2 IMPLANT
SUT MON AB 0 CT1 (SUTURE) ×2 IMPLANT
SUT MON AB 2-0 CT1 36 (SUTURE) ×2 IMPLANT
SYR 30ML LL (SYRINGE) ×2 IMPLANT
SYR BULB IRRIGATION 50ML (SYRINGE) ×4 IMPLANT
YANKAUER SUCT 12FT TUBE ARGYLE (SUCTIONS) ×2 IMPLANT
YANKAUER SUCT BULB TIP NO VENT (SUCTIONS) ×2 IMPLANT

## 2011-05-23 NOTE — Brief Op Note (Signed)
05/21/2011 - 05/23/2011  5:01 PM  PATIENT:  Wendy Coleman  77 y.o. female  PRE-OPERATIVE DIAGNOSIS:  left hip fracture  POST-OPERATIVE DIAGNOSIS:  left hip fracture  PROCEDURE:  Procedure(s): OPEN REDUCTION INTERNAL FIXATION HIP  SURGEON:  Surgeon(s): Fuller Canada, MD  PHYSICIAN ASSISTANT:   ASSISTANTS: Pervis Hocking   ANESTHESIA:   spinal  ESTIMATED BLOOD LOSS: * 150cc*   BLOOD ADMINISTERED:none  DRAINS: none   LOCAL MEDICATIONS USED:  MARCAINE 60CC  SPECIMEN:  No Specimen  DISPOSITION OF SPECIMEN:  N/A  COUNTS:  YES  TOURNIQUET:  * No tourniquets in log *  DICTATION #:   PLAN OF CARE: pacu then floor   PATIENT DISPOSITION:  PACU - hemodynamically stable.   Delay start of Pharmacological VTE agent (>24hrs) due to surgical blood loss or risk of bleeding:  yes

## 2011-05-23 NOTE — Anesthesia Preprocedure Evaluation (Addendum)
Anesthesia Evaluation  Name, MR# and DOB Patient confused  General Assessment Comment  Reviewed: Allergy & Precautions, H&P , NPO status , Patient's Chart, lab work & pertinent test results  History of Anesthesia Complications Negative for: history of anesthetic complications  Airway Mallampati: II TM Distance: >3 FB Neck ROM: Limited  Mouth opening: Limited Mouth Opening  Dental  (+) Teeth Intact   Pulmonary    pulmonary exam normalPulmonary Exam Normal     Cardiovascular hypertension, Pt. on medications Regular Normal    Neuro/Psych Negative Neurological ROS  Negative Psych ROS  GI/Hepatic/Renal negative GI ROS  negative Liver ROS  negative Renal ROS        Endo/Other  Negative Endocrine ROS (+)      Abdominal Normal abdominal exam  (+)   Musculoskeletal  (+) Arthritis -, Osteoarthritis,    Hematology  (+) Blood dyscrasia, anemia, ,   Peds  Reproductive/Obstetrics    Anesthesia Other Findings             Anesthesia Physical Anesthesia Plan  ASA: III  Anesthesia Plan: Spinal   Post-op Pain Management:    Induction: Intravenous  Airway Management Planned: Nasal Cannula  Additional Equipment:   Intra-op Plan:   Post-operative Plan:   Informed Consent: I have reviewed the patients History and Physical, chart, labs and discussed the procedure including the risks, benefits and alternatives for the proposed anesthesia with the patient or authorized representative who has indicated his/her understanding and acceptance.     Plan Discussed with: CRNA  Anesthesia Plan Comments:         Anesthesia Quick Evaluation

## 2011-05-23 NOTE — Anesthesia Postprocedure Evaluation (Signed)
Anesthesia Post Note  Patient: Wendy Coleman  Procedure(s) Performed:  OPEN REDUCTION INTERNAL FIXATION HIP - with Gamma Nail  Anesthesia type: Spinal  Patient location: PACU  Post pain: Pain level controlled  Post assessment: Post-op Vital signs reviewed, Patient's Cardiovascular Status Stable and Respiratory Function Stable  Last Vitals:  Filed Vitals:   05/23/11 1730  BP: 134/53  Pulse: 77  Temp:   Resp: 14    Post vital signs: Reviewed and stable  Level of consciousness: awake and oriented  Complications: No apparent anesthesia complications

## 2011-05-23 NOTE — Transfer of Care (Addendum)
  Anesthesia Post-op Note  Patient: Wendy Coleman  Procedure(s) Performed:  OPEN REDUCTION INTERNAL FIXATION HIP - with Gamma Nail  Patient Location: PACU  Anesthesia Type: Regional  Level of Consciousness: awake and alert   Airway and Oxygen Therapy: Patient Spontanous Breathing and Patient connected to face mask oxygen  Post-op Pain: 0 /10, mild  Post-op Assessment: Post-op Vital signs reviewed, Patient's Cardiovascular Status Stable and Respiratory Function Stable  Post-op Vital Signs: Reviewed and stable  Complications: No apparent anesthesia complications    Saturday 05-24-2011 @ 0930 am,   Doing well in room #331. As before. Comfortable

## 2011-05-23 NOTE — Op Note (Signed)
Preop diagnosis closed left intertrochanteric fracture of the hip, three-part Postop diagnosis same Procedure closed reduction internal fixation, open treatment internal fixation left hip Surgeon Romeo Apple Assisted by Haydee Monica Operative findings three-part intertrochanteric fracture left hip with postreduction and intact posterior medial buttress Implants 125 gamma nail with acorn in dynamic mode, sliding screw in dynamic mode, 95 lag screw, 35 locking bolt  History 78 year old female ambulates with a walker fell and fractured her left hip resides at [facility]  After site marking and chart update the patient was taken to the operating room where she received spinal anesthesia. She was placed on the fracture table with padding of the right leg. We noticed a left great to heel pressure sore and a grade 1 dorsal pressure sore also on the left foot.  In traction and manipulation of the limb was performed under C-arm guidance until a closed stable reduction was obtained. The leg was then prepped and draped sterilely. After completing the timeout a lateral incision was made over the greater trochanter extended proximally. Subcutaneous tissues were divided and the subcutaneous hemorrhage was controlled with electrocautery. Further dissection was carried out to the fascia which was split in line with the skin incision and then blunt dissection was carried out in the abductor musculature into the greater trochanter was palpated. A curved awl was passed into the greater trochanter into the femoral canal. This was confirmed by x-ray. A guidewire was placed into the cannulated awl and passed to the knee and confirmed by x-ray. Using manufacturer's recommended technique reaming was performed over the guidewire to the level of the lesser trochanter. 125 nail was passed over the guidewire but would not advance. The nail was removed and the femoral canal was reamed with a 10, 11, and 12 mm reamer. The nail was  then passed over a guidewire again and passed easily. A lateral incision was made in preparation for the lag screw. Using the cannula and perforating drill the guidewire was placed across the fracture site into the femoral head. X-rays confirmed position. The pin was then measured. A 95 mm setting was placed on the reamer and the reamer was passed over the guidewire under C-arm guidance. The 95 lag screw was placed. The acorn was then placed proximally. The acorn was placed in a dynamic mode. The acorn was confirmed to be engaged per manufacture a technique.  Traction was then released.  We then made a second stab wound laterally past the locking bolt cannula drilled across the nail and passed a 35 mm locking bolt.  Wounds were then irrigated radiographs confirmed position of the fracture and of the implant.  Layered closure was performed proximally with 0 Monocryl and 2-0 Monocryl followed by reapproximation of skin edges with staples  The 2 distal incisions were closed with staples.  A total of 60 cc of Marcaine was injected into the wound area with 30 cc injected beneath the fascial layer and 30 cc in the subcutaneous layer.  Sterile dressing was applied  The patient was taken to recovery room in stable condition  Postop plan is for full weightbearing.

## 2011-05-23 NOTE — Anesthesia Procedure Notes (Signed)
Procedures

## 2011-05-24 MED ORDER — ACETAMINOPHEN 500 MG PO TABS
500.0000 mg | ORAL_TABLET | Freq: Four times a day (QID) | ORAL | Status: DC
  Administered 2011-05-24 – 2011-05-26 (×6): 500 mg via ORAL
  Filled 2011-05-24 (×5): qty 1

## 2011-05-24 MED ORDER — SODIUM CHLORIDE 0.9 % IJ SOLN
INTRAMUSCULAR | Status: AC
  Administered 2011-05-24: 08:00:00
  Filled 2011-05-24: qty 10

## 2011-05-24 MED ORDER — ONDANSETRON HCL 4 MG/2ML IJ SOLN: 4.0000 mg | Freq: Four times a day (QID) | INTRAMUSCULAR | Status: DC | PRN

## 2011-05-24 MED ORDER — DIPHENHYDRAMINE HCL 12.5 MG/5ML PO ELIX: 12.5000 mg | ORAL_SOLUTION | ORAL | Status: DC | PRN

## 2011-05-24 MED ORDER — SENNOSIDES-DOCUSATE SODIUM 8.6-50 MG PO TABS: 1.0000 | ORAL_TABLET | Freq: Every evening | ORAL | Status: DC | PRN

## 2011-05-24 MED ORDER — ONDANSETRON HCL 4 MG PO TABS: 4.0000 mg | ORAL_TABLET | Freq: Four times a day (QID) | ORAL | Status: DC | PRN

## 2011-05-24 MED ORDER — METOCLOPRAMIDE HCL 10 MG PO TABS: 5.0000 mg | ORAL_TABLET | Freq: Three times a day (TID) | ORAL | Status: DC | PRN

## 2011-05-24 MED ORDER — METOCLOPRAMIDE HCL 5 MG/ML IJ SOLN: 5.0000 mg | Freq: Three times a day (TID) | INTRAMUSCULAR | Status: DC | PRN

## 2011-05-24 MED ORDER — ENOXAPARIN SODIUM 30 MG/0.3ML ~~LOC~~ SOLN
30.0000 mg | SUBCUTANEOUS | Status: DC
  Administered 2011-05-24 – 2011-05-26 (×2): 30 mg via SUBCUTANEOUS
  Filled 2011-05-24: qty 0.3

## 2011-05-24 NOTE — Progress Notes (Signed)
Encounter addended by: Marylene Buerger, CRNA on: 05/24/2011  9:43 AM<BR>     Documentation filed: Notes Section

## 2011-05-24 NOTE — Progress Notes (Signed)
Physical Therapy Evaluation Patient Name: Wendy Coleman Date: 05/24/2011 Problem List: There is no problem list on file for this patient.  Past Medical History:  Past Medical History  Diagnosis Date  . Hypertension    Past Surgical History: History reviewed. No pertinent past surgical history.  Precautions/Restrictions  Precautions Precautions: Fall Required Braces or Orthoses: No Restrictions Weight Bearing Restrictions: No LLE Weight Bearing: Weight bearing as tolerated Prior Functioning  Home Living Type of Home: Assisted living Prior Function Level of Independence: Independent with gait Driving: No Vocation: Retired Producer, television/film/video: Awake/alert (confused) Overall Cognitive Status: Impaired Orientation Level: Oriented to person Cognition - Other Comments: at normal baseline Sensation/Coordination   Extremity Assessment RLE Assessment RLE Assessment: Within Functional Limits LLE Assessment LLE Assessment: Exceptions to [facility] LLE AROM (degrees) Left Hip Flexion 0-125: 30  (due to increased pain) Left Hip ABduction 0-45: 25  (25 due to increased pain) Mobility (including Balance) Bed Mobility Bed Mobility: Yes Supine to Sit: 2: Max assist Supine to Sit Details (indicate cue type and reason): assisted scooting to the left of the bed needed max assist to sit Sitting - Scoot to Edge of Bed: 3: Mod assist Transfers Transfers: Yes Sit to Stand: 3: Mod assist Stand to Sit: 4: Min assist Stand to Sit Details: needed physical cue to reach back to the chair Ambulation/Gait Ambulation/Gait: Yes Ambulation/Gait Assistance: 3: Mod assist Ambulation/Gait Assistance Details (indicate cue type and reason): slow small steps needs vc to move feet. Ambulation Distance (Feet): 3 Feet Assistive device: Rolling walker Gait Pattern: Step-to pattern;Decreased step length - right;Decreased weight shift to left Gait velocity: slow Stairs: No Wheelchair  Mobility Wheelchair Mobility: No  Balance Balance Assessed: No Exercise  Total Joint Exercises Ankle Circles/Pumps: AROM;Both;5 reps Quad Sets: Both;5 reps Heel Slides: Both;10 reps Hip ABduction/ADduction: Both;10 reps General Exercises - Lower Extremity Ankle Circles/Pumps: AROM;Both;5 reps Quad Sets: Both;5 reps Heel Slides: Both;10 reps Hip ABduction/ADduction: Both;10 reps Low Level/ICU Exercises Ankle Circles/Pumps: AROM;Both;5 reps Quad Sets: Both;5 reps Hip ABduction/ADduction: Both;10 reps Heel Slides: Both;10 reps  End of Session PT - End of Session Equipment Utilized During Treatment: Gait belt;Other (comment) (rolling walker) Activity Tolerance: Patient limited by pain Patient left: in chair;with family/visitor present Nurse Communication: Mobility status for transfers General Behavior During Session: [facility] for tasks performed Cognition: [facility] for tasks performed PT Assessment/Plan/Recommendation PT Assessment Clinical Impression Statement: Pt with fractured L hip who will benefit from increased mobility and strengthening PT Recommendation/Assessment: Patient will need skilled PT in the acute care venue PT Problem List: Decreased strength;Decreased activity tolerance;Pain;Decreased mobility;Decreased balance PT Therapy Diagnosis : Difficulty walking;Acute pain PT Plan PT Frequency: 7X/week (Goal is to go back to [facility].) PT Treatment/Interventions: Gait training;Therapeutic exercise PT Recommendation Follow Up Recommendations: Outpatient PT (If pt can go back to [facility] PT Goals  Acute Rehab PT Goals PT Goal Formulation: With patient/family Time For Goal Achievement: 4 days Pt will go Supine/Side to Sit: with mod assist Pt will go Sit to Supine/Side: with mod assist Pt will Transfer Sit to Stand/Stand to Sit: with min assist Pt will Ambulate: 1 - 15 feet;with min assist;with rolling walker RUSSELL,CINDY 05/24/2011, 1:49 PM

## 2011-05-24 NOTE — Progress Notes (Signed)
Subjective: 1 Day Post-Op Procedure(s) (LRB): OPEN REDUCTION INTERNAL FIXATION HIP (Left) Patient reports pain as 0 on 0-10 scale.    Objective: Vital signs in last 24 hours: Temp:  [96.9 F (36.1 C)-98 F (36.7 C)] 97.5 F (36.4 C) (08/25 1000) Pulse Rate:  [77-108] 82  (08/25 1000) Resp:  [9-25] 18  (08/25 1000) BP: (124-140)/(45-75) 128/65 mmHg (08/25 1000) SpO2:  [84 %-100 %] 96 % (08/25 1000)  Intake/Output from previous day: 08/24 0701 - 08/25 0700 In: 1200 [P.O.:200; I.V.:1000] Out: 645 [Urine:495; Blood:150] Intake/Output this shift:     Basename 05/21/11 1618  HGB 10.9*    Basename 05/21/11 1618  WBC 7.2  RBC 3.63*  HCT 33.9*  PLT 258    Basename 05/21/11 1618  NA 140  K 4.0  CL 104  CO2 29  BUN 23  CREATININE 0.72  GLUCOSE 102*  CALCIUM 9.4    Basename 05/21/11 1618  LABPT --  INR 0.96    Neurologically intact Neurovascular intact Sensation intact distally Intact pulses distally Dorsiflexion/Plantar flexion intact dorasl and heel sore   Assessment/Plan: 1 Day Post-Op Procedure(s) (LRB): OPEN REDUCTION INTERNAL FIXATION HIP (Left) Advance diet Try PT   Fuller Canada 05/24/2011, 11:34 AM

## 2011-05-25 LAB — COMPREHENSIVE METABOLIC PANEL
ALT: 13 U/L (ref 0–35)
Alkaline Phosphatase: 51 U/L (ref 39–117)
BUN: 35 mg/dL — ABNORMAL HIGH (ref 6–23)
CO2: 29 mEq/L (ref 19–32)
Calcium: 8.4 mg/dL (ref 8.4–10.5)
GFR calc Af Amer: >60 mL/min (ref >60)
GFR calc non Af Amer: 57 mL/min — ABNORMAL LOW (ref >60)
Glucose, Bld: 96 mg/dL (ref 70–99)
Potassium: 3.8 mEq/L (ref 3.5–5.1)
Sodium: 139 mEq/L (ref 135–145)

## 2011-05-25 LAB — CBC
HCT: 22.2 % — ABNORMAL LOW (ref 36.0–46.0)
Hemoglobin: 7.2 g/dL — ABNORMAL LOW (ref 12.0–15.0)
MCH: 30.4 pg (ref 26.0–34.0)
RBC: 2.37 MIL/uL — ABNORMAL LOW (ref 3.87–5.11)

## 2011-05-25 MED ORDER — SODIUM CHLORIDE 0.9 % IJ SOLN
INTRAMUSCULAR | Status: AC
  Administered 2011-05-25: 16:00:00
  Filled 2011-05-25: qty 30

## 2011-05-25 NOTE — Progress Notes (Signed)
Subjective: 2 Days Post-Op Procedure(s) (LRB): OPEN REDUCTION INTERNAL FIXATION HIP (Left) Patient reports pain as can not determine.    Objective: Vital signs in last 24 hours: Temp:  [97.4 F (36.3 C)-98.5 F (36.9 C)] 98 F (36.7 C) (08/25 2217) Pulse Rate:  [78-97] 81  (08/25 2217) Resp:  [18-20] 18  (08/25 2217) BP: (114-130)/(64-72) 130/64 mmHg (08/26 0624) SpO2:  [91 %-94 %] 92 % (08/25 2217)  Intake/Output from previous day: 08/25 0701 - 08/26 0700 In: 100 [P.O.:100] Out: 575 [Urine:575] Intake/Output this shift: I/O this shift: In: 30 [P.O.:30] Out: -    Basename 05/25/11 0530  HGB 7.2*    Basename 05/25/11 0530  WBC 9.4  RBC 2.37*  HCT 22.2*  PLT 182    Basename 05/25/11 0530  NA 139  K 3.8  CL 104  CO2 29  BUN 35*  CREATININE 0.92  GLUCOSE 96  CALCIUM 8.4   No results found for this basename: LABPT:2,INR:2 in the last 72 hours  heel blister , somnolent but arousable   Assessment/Plan: 2 Days Post-Op Procedure(s) (LRB): OPEN REDUCTION INTERNAL FIXATION HIP (Left) Advance diet Up with therapy  Wendy Coleman 05/25/2011, 10:33 AM

## 2011-05-25 NOTE — Progress Notes (Signed)
Physical Therapy Treatment Patient Name: Wendy Coleman [id number] Date: 05/25/2011 Problem List: There is no problem list on file for this patient.  Past Medical History:  Past Medical History  Diagnosis Date  . Hypertension    Past Surgical History: History reviewed. No pertinent past surgical history. Precautions/Restrictions  Precautions Precautions: Fall Required Braces or Orthoses: No Restrictions Weight Bearing Restrictions: No LLE Weight Bearing: Weight bearing as tolerated Mobility (including Balance) Bed Mobility Bed Mobility: Yes Supine to Sit: 2: Max assist Supine to Sit Details (indicate cue type and reason): Pt very lethargic today would not open her eyes. Sitting - Scoot to Edge of Bed: Not tested (comment) (after 10 min. of sitting could not get pt awake) Transfers Transfers: No Ambulation/Gait Ambulation/Gait: No Stairs: No Wheelchair Mobility Wheelchair Mobility: No  Posture/Postural Control Posture/Postural Control: Postural limitations Postural Limitations: sitting balance was poor today pt falling to the left. Balance Balance Assessed: Yes Static Sitting Balance Static Sitting - Balance Support: Feet supported (Pt would not use her own UE; max assist) Static Sitting - Level of Assistance: 2: Max assist Static Sitting - Comment/# of Minutes: 10 min. Exercise  Total Joint Exercises Ankle Circles/Pumps: PROM;Both;10 reps Heel Slides: PROM;Both;10 reps Hip ABduction/ADduction: PROM;Both;10 reps General Exercises - Lower Extremity Ankle Circles/Pumps: PROM;Both;10 reps Heel Slides: PROM;Both;10 reps Hip ABduction/ADduction: PROM;Both;10 reps Low Level/ICU Exercises Ankle Circles/Pumps: PROM;Both;10 reps Hip ABduction/ADduction: PROM;Both;10 reps Heel Slides: PROM;Both;10 reps  End of Session PT - End of Session Equipment Utilized During Treatment: Gait belt Activity Tolerance: Other (comment) (limited by lethargic state) Patient left: in bed;with  family/visitor present;with bed alarm set Nurse Communication: Mobility status for transfers General Behavior During Session: Lethargic Cognition: Impaired PT Assessment/Plan  PT - Assessment/Plan Comments on Treatment Session: Pt much more lethargic today did not participate in ambulation if this continues goals will need to be changed and pt will need SNF PT Plan: Discharge plan needs to be updated (may need SNF ) PT Frequency: Min 5X/week (decrease to 5x secondary to lethargic state.) Follow Up Recommendations: Skilled nursing facility PT Goals  Acute Rehab PT Goals PT Goal: Supine/Side to Sit - Progress: Not met PT Goal: Sit to Supine/Side - Progress: Not met PT Transfer Goal: Sit to Stand/Stand to Sit - Progress: Not met PT Goal: Ambulate - Progress: Not met  Wendy Coleman,CINDY 05/25/2011, 12:37 PM

## 2011-05-26 DIAGNOSIS — L89609 Pressure ulcer of unspecified heel, unspecified stage: Secondary | ICD-10-CM | POA: Clinically undetermined

## 2011-05-26 DIAGNOSIS — S72143A Displaced intertrochanteric fracture of unspecified femur, initial encounter for closed fracture: Secondary | ICD-10-CM | POA: Diagnosis present

## 2011-05-26 DIAGNOSIS — F0390 Unspecified dementia without behavioral disturbance: Secondary | ICD-10-CM | POA: Diagnosis present

## 2011-05-26 LAB — COMPREHENSIVE METABOLIC PANEL
AST: 33 U/L (ref 0–37)
Albumin: 2.2 g/dL — ABNORMAL LOW (ref 3.5–5.2)
Calcium: 8 mg/dL — ABNORMAL LOW (ref 8.4–10.5)
Creatinine, Ser: 0.8 mg/dL (ref 0.50–1.10)
Sodium: 140 mEq/L (ref 135–145)

## 2011-05-26 MED ORDER — HYDROCODONE-ACETAMINOPHEN 5-325 MG PO TABS: 1.0000 | ORAL_TABLET | ORAL | Status: DC | PRN

## 2011-05-26 NOTE — Consult Note (Signed)
CSW spoke extensively with Pt's POA, Evern Bio. Ms. Stan Head is agreeable to SNF at D/C if Pt can have a private room, which she has at [facility].  If this is not possible, she would like to know if Pt can return to [facility] with PT at 3 or more sessions per week.  CSW contacted [id number] who agreed to have their PT visit Pt today to evaluate.

## 2011-05-26 NOTE — Progress Notes (Signed)
Physical Therapy Treatment Patient Name: Wendy Coleman VHQIO'[id number] Date: 05/26/2011  TIME:930-955 CHARGES: 2 TE Problem List:  Patient Active Problem List  Diagnoses  . Hip fracture, intertrochanteric  . Dementia  . Pressure sore on heel   Past Medical History:  Past Medical History  Diagnosis Date  . Hypertension    Past Surgical History: History reviewed. No pertinent past surgical history. Precautions/Restrictions  Precautions Precautions: Fall Required Braces or Orthoses: No Restrictions Weight Bearing Restrictions: Yes LLE Weight Bearing: Non weight bearing Mobility (including Balance) Bed Mobility Supine to Sit: 3: Mod assist Supine to Sit Details (indicate cue type and reason): assistance needed with LLE and use of rail for UE Sitting - Scoot to Edge of Bed: 6: Modified independent (Device/Increase time) Transfers Transfers:  (not able due to wound on LLE not being dressed) Ambulation/Gait Ambulation/Gait:  (will be attempted in afternoon when would of LLE is dressed)    Exercise  Total Joint Exercises Ankle Circles/Pumps: Both;10 reps;AAROM Gluteal Sets: Both;10 reps Short Arc Quad: Both;10 reps Heel Slides: Both;10 reps;AAROM Hip ABduction/ADduction: Both;10 reps;AAROM Straight Leg Raises: Both;10 reps Long Arc Quad: Both;10 reps;AAROM General Exercises - Lower Extremity Ankle Circles/Pumps: Both;10 reps;AAROM Gluteal Sets: Both;10 reps Short Arc Quad: Both;10 reps Long Arc Quad: Both;10 reps;AAROM Heel Slides: Both;10 reps;AAROM Hip ABduction/ADduction: Both;10 reps;AAROM Straight Leg Raises: Both;10 reps Hip Flexion/Marching: Both;10 reps Toe Raises: Both;10 reps Low Level/ICU Exercises Ankle Circles/Pumps: Both;10 reps;AAROM Short Arc Quad: Both;10 reps Hip ABduction/ADduction: Both;10 reps;AAROM Heel Slides: Both;10 reps;AAROM  End of Session PT - End of Session Equipment Utilized During Treatment: Gait belt Activity Tolerance: Patient limited  by fatigue;Treatment limited secondary to medical complications (Comment) (LLE wound not dressed;unable to wear appropiate footwear/gai) Patient left: in bed;with call bell in reach;with family/visitor present Nurse Communication:  (asked for wound to be dressed as of 1:30 still not) General Behavior During Session: [facility] for tasks performed Cognition: Impaired, at baseline PT Assessment/Plan  PT - Assessment/Plan Comments on Treatment Session: Pt not wanting to participate due to pain;however with encouragement pt was able to complete bed exercises;not able to ambulate with pt this morning due to LLE wound not being dressed,notified nursing at 955 and 1200 at 130 still not dressed;waiting for materials PT Goals  Acute Rehab PT Goals PT Goal: Supine/Side to Sit - Progress: Progressing toward goal PT Goal: Sit to Supine/Side - Progress: Progressing toward goal PT Transfer Goal: Sit to Stand/Stand to Sit - Progress: Progressing toward goal  Allye Hoyos ATKINSO 05/26/2011, 1:48 PM

## 2011-05-26 NOTE — Progress Notes (Signed)
Physical Therapy Treatment Patient Name: IFE VITELLI BJYNW'G Date: 05/26/2011 Problem List:  Patient Active Problem List  Diagnoses  . Hip fracture, intertrochanteric  . Dementia  . Pressure sore on heel   Past Medical History:  Past Medical History  Diagnosis Date  . Hypertension    Past Surgical History: History reviewed. No pertinent past surgical history. Precautions/Restrictions  Precautions Precautions: Fall Required Braces or Orthoses: No Restrictions Weight Bearing Restrictions: Yes LLE Weight Bearing: Non weight bearing Mobility (including Balance) Bed Mobility Supine to Sit: 3: Mod assist Supine to Sit Details (indicate cue type and reason): assistance needed with LLE and use of rail for UE Sitting - Scoot to Edge of Bed: 3: Mod assist Transfers Transfers: Yes Sit to Stand: 3: Mod assist Stand to Sit: 4: Min assist Stand to Sit Details: tried to sit too early-cues to get back to chair Ambulation/Gait Ambulation/Gait: Yes Ambulation/Gait Assistance: 4: Min assist Ambulation Distance (Feet): 5 Feet Assistive device: Rolling walker Gait Pattern: Step-to pattern;Decreased step length - right;Decreased step length - left Gait velocity: slow Stairs: No Wheelchair Mobility Wheelchair Mobility: No    Exercise  Total Joint Exercises Ankle Circles/Pumps: Both;10 reps;AAROM Gluteal Sets: Both;10 reps Short Arc Quad: Both;10 reps Heel Slides: Both;10 reps;AAROM Hip ABduction/ADduction: Both;10 reps;AAROM Straight Leg Raises: Both;10 reps Long Arc Quad: Both;10 reps;AAROM General Exercises - Lower Extremity Ankle Circles/Pumps: Both;10 reps;AAROM Gluteal Sets: Both;10 reps Short Arc Quad: Both;10 reps Long Arc Quad: Both;10 reps;AAROM Heel Slides: Both;10 reps;AAROM Hip ABduction/ADduction: Both;10 reps;AAROM Straight Leg Raises: Both;10 reps Hip Flexion/Marching: Both;10 reps Toe Raises: Both;10 reps Low Level/ICU Exercises Ankle Circles/Pumps: Both;10  reps;AAROM Short Arc Quad: Both;10 reps Hip ABduction/ADduction: Both;10 reps;AAROM Heel Slides: Both;10 reps;AAROM  End of Session PT - End of Session Equipment Utilized During Treatment: Gait belt Activity Tolerance: Patient tolerated treatment well;Patient limited by fatigue (wounds dressed prior to gait) Patient left: in chair;with call bell in reach;with family/visitor present Nurse Communication: Mobility status for transfers;Mobility status for ambulation General Behavior During Session: [facility] for tasks performed Cognition: Impaired, at baseline PT Assessment/Plan  PT - Assessment/Plan Comments on Treatment Session: finally able to work on gait training-pt minimally motivated to try to do much-preferred to stay in bed-she was, however, cooperative and followed all directions PT Plan: Discharge plan remains appropriate Follow Up Recommendations: Skilled nursing facility Equipment Recommended: Defer to next venue PT Goals  Acute Rehab PT Goals PT Goal: Supine/Side to Sit - Progress: Progressing toward goal PT Goal: Sit to Supine/Side - Progress: Progressing toward goal PT Transfer Goal: Sit to Stand/Stand to Sit - Progress: Progressing toward goal PT Goal: Ambulate - Progress: Progressing toward goal  Konrad Penta 05/26/2011, 3:45 PM

## 2011-05-26 NOTE — Progress Notes (Signed)
Subjective: 3 Days Post-Op Procedure(s) (LRB): OPEN REDUCTION INTERNAL FIXATION HIP (Left) Patient reports pain as mild.    Objective: Vital signs in last 24 hours: Temp:  [97.3 F (36.3 C)-98.8 F (37.1 C)] 98.3 F (36.8 C) (08/27 0500) Pulse Rate:  [75-85] 83  (08/27 0500) Resp:  [18-20] 20  (08/27 0500) BP: (125-165)/(64-71) 154/67 mmHg (08/27 0500) SpO2:  [93 %-97 %] 97 % (08/27 0500)  Intake/Output from previous day: 08/26 0701 - 08/27 0700 In: 90 [P.O.:90] Out: 251 [Urine:250; Stool:1] Intake/Output this shift:     Basename 05/25/11 0530  HGB 7.2*    Basename 05/25/11 0530  WBC 9.4  RBC 2.37*  HCT 22.2*  PLT 182    Basename 05/26/11 0516 05/25/11 0530  NA 140 139  K 3.5 3.8  CL 105 104  CO2 27 29  BUN 35* 35*  CREATININE 0.80 0.92  GLUCOSE 92 96  CALCIUM 8.0* 8.4   No results found for this basename: LABPT:2,INR:2 in the last 72 hours  asleep  Assessment/Plan: 3 Days Post-Op Procedure(s) (LRB): OPEN REDUCTION INTERNAL FIXATION HIP (Left) Plan for discharge tomorrow  Fuller Canada 05/26/2011, 7:08 AM

## 2011-05-26 NOTE — Consult Note (Signed)
Pt had hip ORIF surgery on 8/24.  Clear, fluid-filled blister to left  heel noted in chart on 8/25. Stage 2 wound 5X5cm with skin intact without open wound or drainage.  Anterior foot with dk purple deep tissue injury 5X6.5 cm with skin beginning to peel and evolve into stage 2 wound. Pulse palpable to left foot, skin intact to right heel upon assessment.  Plan:  Prevalon boot to reduce pressure, foam dressing to protect anterior foot and promote healing. Will not plan to follow further unless re-consulted.  Thank-you, Cammie Mcgee MSN, [employer]

## 2011-05-27 LAB — COMPREHENSIVE METABOLIC PANEL
Alkaline Phosphatase: 53 U/L (ref 39–117)
BUN: 34 mg/dL — ABNORMAL HIGH (ref 6–23)
CO2: 27 mEq/L (ref 19–32)
Chloride: 106 mEq/L (ref 96–112)
GFR calc Af Amer: >60 mL/min (ref >60)
GFR calc non Af Amer: >60 mL/min (ref >60)
Glucose, Bld: 108 mg/dL — ABNORMAL HIGH (ref 70–99)
Potassium: 3.4 mEq/L — ABNORMAL LOW (ref 3.5–5.1)
Total Bilirubin: 0.9 mg/dL (ref 0.3–1.2)

## 2011-05-27 MED ORDER — ENOXAPARIN SODIUM 30 MG/0.3ML ~~LOC~~ SOLN: 30.0000 mg | SUBCUTANEOUS | Status: DC

## 2011-05-27 MED ORDER — HYDROCODONE-ACETAMINOPHEN 5-325 MG PO TABS: 1.0000 | ORAL_TABLET | ORAL | Status: AC | PRN

## 2011-05-27 MED ORDER — SENNOSIDES-DOCUSATE SODIUM 8.6-50 MG PO TABS: 1.0000 | ORAL_TABLET | Freq: Every evening | ORAL | Status: AC | PRN

## 2011-05-27 NOTE — Progress Notes (Signed)
Subjective: 4 Days Post-Op Procedure(s) (LRB): OPEN REDUCTION INTERNAL FIXATION HIP (Left) Patient reports pain as mild.    Objective: Vital signs in last 24 hours: Temp:  [97.9 F (36.6 C)-98.4 F (36.9 C)] 98.3 F (36.8 C) (08/28 0510) Pulse Rate:  [78-96] 81  (08/28 0510) Resp:  [18-21] 18  (08/28 0510) BP: (136-176)/(65-81) 136/81 mmHg (08/28 0510) SpO2:  [95 %-98 %] 95 % (08/28 0510)  Intake/Output from previous day: 08/27 0701 - 08/28 0700 In: 60 [P.O.:60] Out: 150 [Urine:150] Intake/Output this shift:     Basename 05/25/11 0530  HGB 7.2*    Basename 05/25/11 0530  WBC 9.4  RBC 2.37*  HCT 22.2*  PLT 182    Basename 05/27/11 0501 05/26/11 0516  NA 141 140  K 3.4* 3.5  CL 106 105  CO2 27 27  BUN 34* 35*  CREATININE 0.70 0.80  GLUCOSE 108* 92  CALCIUM 8.2* 8.0*   No results found for this basename: LABPT:2,INR:2 in the last 72 hours  see d/c summary  Assessment/Plan: 4 Days Post-Op Procedure(s) (LRB): OPEN REDUCTION INTERNAL FIXATION HIP (Left) Discharge to SNF  [facility] 05/27/2011, 7:53 AM

## 2011-05-27 NOTE — Progress Notes (Signed)
Subjective: 4 Days Post-Op Procedure(s) (LRB): OPEN REDUCTION INTERNAL FIXATION HIP (Left) Patient reports pain as mild.    Objective: Vital signs in last 24 hours: Temp:  [97.9 F (36.6 C)-98.4 F (36.9 C)] 98.3 F (36.8 C) (08/28 0510) Pulse Rate:  [78-96] 81  (08/28 0510) Resp:  [18-21] 18  (08/28 0510) BP: (136-176)/(65-81) 136/81 mmHg (08/28 0510) SpO2:  [95 %-98 %] 95 % (08/28 0510)  Intake/Output from previous day: 08/27 0701 - 08/28 0700 In: 60 [P.O.:60] Out: 150 [Urine:150] Intake/Output this shift:     Basename 05/25/11 0530  HGB 7.2*    Basename 05/25/11 0530  WBC 9.4  RBC 2.37*  HCT 22.2*  PLT 182    Basename 05/27/11 0501 05/26/11 0516  NA 141 140  K 3.4* 3.5  CL 106 105  CO2 27 27  BUN 34* 35*  CREATININE 0.70 0.80  GLUCOSE 108* 92  CALCIUM 8.2* 8.0*   No results found for this basename: LABPT:2,INR:2 in the last 72 hours  stable  Assessment/Plan: 4 Days Post-Op Procedure(s) (LRB): OPEN REDUCTION INTERNAL FIXATION HIP (Left) Discharge to SNF  [facility] 05/27/2011, 8:00 AM

## 2011-05-27 NOTE — Discharge Summary (Signed)
Physician Discharge Summary  Patient ID: Wendy Coleman MRN: [id number] DOB/AGE: Jan 04, 1911 77 y.o.  Admit date: 05/21/2011 Discharge date: 05/27/2011  Admission Diagnoses: Fracture left hip  Discharge Diagnoses: Fracture left hip #2 left heel pressure sore Principal Problem:  *Hip fracture, intertrochanteric Active Problems:  Pressure sore on heel  Dementia   Discharged Condition: stable  Hospital Course: The patient was admitted on August 22 and underwent open treatment internal fixation of the left hip on August 24. She tolerated that well. In the initial postop period she was somnolent. This was resolved after reducing her pain medications. She was able to tolerate physical therapy. We did notice that she had a heel pressure sore and a dorsal pressure sore over the left foot. This was noted at the time of surgery and was thought to be secondary to the traction device.  Consults: none  Significant Diagnostic Studies: Hemoglobin 7.2  Treatments: Physical therapy and wound care evaluation  Discharge Exam: Blood pressure 136/81, pulse 81, temperature 98.3 F (36.8 C), temperature source Oral, resp. rate 18, height 5\' 1"  (1.549 m), weight 59.875 kg (132 lb), SpO2 95.00%. General appearance: Small frame, normal, stable. Incision/Wound: clean.  Disposition: Final discharge disposition not confirmed  Discharge Orders    Future Orders Please Complete By Expires   Diet - low sodium heart healthy      Call MD / Call 911      Comments:   If you experience chest pain or shortness of breath, CALL 911 and be transported to the hospital emergency room.  If you develope a fever above 101 F, pus (white drainage) or increased drainage or redness at the wound, or calf pain, call your surgeon's office.   Constipation Prevention      Comments:   Drink plenty of fluids.  Prune juice may be helpful.  You may use a stool softener, such as Colace (over the counter) 100 mg twice a day.  Use  MiraLax (over the counter) for constipation as needed.   Increase activity slowly as tolerated      Weight Bearing as taught in Physical Therapy      Comments:   Use a walker or crutches as instructed.   DO NOT drive, shower or take a tub bath until instructed by your physician      Discharge wound care:      Comments:   If you have a hip bandage, keep it clean and dry.  Change your bandage as instructed by your health care providers.  If your bandage has been discontinued, keep your incision clean and dry.  Pat dry after bathing.  DO NOT put lotion or powder on your incision.   Discharge instructions      Comments:   lovenox x 25 days Staples out POD # 14 WBAT      Current Discharge Medication List    START taking these medications   Details  enoxaparin (LOVENOX) 30 MG/0.3ML SOLN Inject 0.3 mLs (30 mg total) into the skin daily. Qty: 8.4 mL    HYDROcodone-acetaminophen (NORCO) 5-325 MG per tablet Take 1 tablet by mouth every 4 (four) hours as needed. Qty: 40 tablet, Refills: 2    senna-docusate (SENOKOT-S) 8.6-50 MG per tablet Take 1 tablet by mouth at bedtime as needed for constipation.      CONTINUE these medications which have NOT CHANGED   Details  bimatoprost (LUMIGAN) 0.01 % SOLN Apply 1 drop to eye at bedtime.      citalopram (CELEXA)  10 MG tablet Take 10 mg by mouth every morning.      docusate sodium (DULCOLAX) 100 MG capsule Take 100 mg by mouth daily.      lisinopril (PRINIVIL,ZESTRIL) 5 MG tablet Take 5 mg by mouth every morning.      OLANZapine (ZYPREXA) 2.5 MG tablet Take 2.5 mg by mouth at bedtime.      Polyethyl Glycol-Propyl Glycol (SYSTANE ULTRA OP) Apply 1 drop to eye 2 (two) times daily.      polyethylene glycol (MIRALAX / GLYCOLAX) packet Take 17 g by mouth daily. Mix 17g in 8oz water daily           Signed: Fuller Canada 05/27/2011, 7:54 AM

## 2011-05-27 NOTE — Consult Note (Signed)
Pt to D/C today to [facility].  CSW spoke with staff at facility and with Pt's POA.  Both are in agreement with D/C plan. Pt to be transported by [facility].  CSW to sign off at this time.

## 2011-05-29 ENCOUNTER — Encounter (HOSPITAL_COMMUNITY): Payer: Self-pay | Admitting: Orthopedic Surgery

## 2011-06-03 ENCOUNTER — Inpatient Hospital Stay (HOSPITAL_COMMUNITY)
Admission: EM | Admit: 2011-06-03 | Discharge: 2011-06-09 | DRG: 300 | Disposition: A | Discharge status: Nursing Home (Includes ICF) | Payer: Medicare Other | Attending: Internal Medicine | Admitting: Internal Medicine

## 2011-06-03 ENCOUNTER — Encounter (HOSPITAL_COMMUNITY): Payer: Self-pay | Admitting: Emergency Medicine

## 2011-06-03 DIAGNOSIS — L97509 Non-pressure chronic ulcer of other part of unspecified foot with unspecified severity: Secondary | ICD-10-CM | POA: Diagnosis present

## 2011-06-03 DIAGNOSIS — L89609 Pressure ulcer of unspecified heel, unspecified stage: Secondary | ICD-10-CM

## 2011-06-03 DIAGNOSIS — I10 Essential (primary) hypertension: Secondary | ICD-10-CM

## 2011-06-03 DIAGNOSIS — Z9889 Other specified postprocedural states: Secondary | ICD-10-CM

## 2011-06-03 DIAGNOSIS — M7989 Other specified soft tissue disorders: Secondary | ICD-10-CM

## 2011-06-03 DIAGNOSIS — F0390 Unspecified dementia without behavioral disturbance: Secondary | ICD-10-CM

## 2011-06-03 DIAGNOSIS — S72143A Displaced intertrochanteric fracture of unspecified femur, initial encounter for closed fracture: Secondary | ICD-10-CM

## 2011-06-03 DIAGNOSIS — L03119 Cellulitis of unspecified part of limb: Secondary | ICD-10-CM

## 2011-06-03 DIAGNOSIS — L8992 Pressure ulcer of unspecified site, stage 2: Secondary | ICD-10-CM | POA: Diagnosis present

## 2011-06-03 DIAGNOSIS — L03116 Cellulitis of left lower limb: Secondary | ICD-10-CM

## 2011-06-03 DIAGNOSIS — I824Y9 Acute embolism and thrombosis of unspecified deep veins of unspecified proximal lower extremity: Principal | ICD-10-CM | POA: Diagnosis present

## 2011-06-03 DIAGNOSIS — L02619 Cutaneous abscess of unspecified foot: Secondary | ICD-10-CM | POA: Diagnosis present

## 2011-06-03 DIAGNOSIS — D62 Acute posthemorrhagic anemia: Secondary | ICD-10-CM | POA: Diagnosis present

## 2011-06-03 LAB — CBC
HCT: 23.7 % — ABNORMAL LOW (ref 36.0–46.0)
MCH: 30.2 pg (ref 26.0–34.0)
MCHC: 30.8 g/dL (ref 30.0–36.0)
MCV: 97.9 fL (ref 78.0–100.0)
Platelets: 461 10*3/uL — ABNORMAL HIGH (ref 150–400)
RDW: 16.7 % — ABNORMAL HIGH (ref 11.5–15.5)

## 2011-06-03 LAB — BASIC METABOLIC PANEL
BUN: 22 mg/dL (ref 6–23)
Calcium: 8.3 mg/dL — ABNORMAL LOW (ref 8.4–10.5)
Chloride: 104 mEq/L (ref 96–112)
Creatinine, Ser: 0.87 mg/dL (ref 0.50–1.10)
GFR calc Af Amer: >60 mL/min (ref >60)
GFR calc non Af Amer: >60 mL/min (ref >60)

## 2011-06-03 MED ORDER — ACETAMINOPHEN 325 MG PO TABS
650.0000 mg | ORAL_TABLET | Freq: Four times a day (QID) | ORAL | Status: DC | PRN
  Administered 2011-06-04 – 2011-06-09 (×7): 650 mg via ORAL
  Filled 2011-06-03: qty 2
  Filled 2011-06-03 (×2): qty 1
  Filled 2011-06-03 (×6): qty 2

## 2011-06-03 MED ORDER — CITALOPRAM HYDROBROMIDE 20 MG PO TABS
10.0000 mg | ORAL_TABLET | ORAL | Status: DC
  Administered 2011-06-04 – 2011-06-09 (×6): 10 mg via ORAL
  Filled 2011-06-03 (×6): qty 1

## 2011-06-03 MED ORDER — ENOXAPARIN SODIUM 60 MG/0.6ML ~~LOC~~ SOLN
1.0000 mg/kg | Freq: Two times a day (BID) | SUBCUTANEOUS | Status: DC
  Administered 2011-06-04 – 2011-06-09 (×11): 50 mg via SUBCUTANEOUS
  Filled 2011-06-03 (×11): qty 0.6

## 2011-06-03 MED ORDER — POLYETHYLENE GLYCOL 3350 17 G PO PACK
17.0000 g | PACK | Freq: Every day | ORAL | Status: DC
  Administered 2011-06-04 – 2011-06-09 (×3): 17 g via ORAL
  Filled 2011-06-03 (×3): qty 1

## 2011-06-03 MED ORDER — OLANZAPINE 5 MG PO TABS
2.5000 mg | ORAL_TABLET | Freq: Every day | ORAL | Status: DC
  Administered 2011-06-04 – 2011-06-08 (×5): 2.5 mg via ORAL
  Filled 2011-06-03 (×5): qty 1

## 2011-06-03 MED ORDER — ACETAMINOPHEN 650 MG RE SUPP: 650.0000 mg | Freq: Four times a day (QID) | RECTAL | Status: DC | PRN

## 2011-06-03 MED ORDER — SODIUM CHLORIDE 0.45 % IV SOLN: INTRAVENOUS | Status: AC

## 2011-06-03 MED ORDER — ONDANSETRON HCL 4 MG/2ML IJ SOLN: 4.0000 mg | Freq: Four times a day (QID) | INTRAMUSCULAR | Status: DC | PRN

## 2011-06-03 MED ORDER — FLEET ENEMA 7-19 GM/118ML RE ENEM: 1.0000 | ENEMA | Freq: Every day | RECTAL | Status: DC | PRN

## 2011-06-03 MED ORDER — ONDANSETRON HCL 4 MG PO TABS: 4.0000 mg | ORAL_TABLET | Freq: Four times a day (QID) | ORAL | Status: DC | PRN

## 2011-06-03 MED ORDER — BIMATOPROST 0.01 % OP SOLN
1.0000 [drp] | Freq: Every day | OPHTHALMIC | Status: DC
  Administered 2011-06-04 – 2011-06-08 (×5): 1 [drp] via OPHTHALMIC
  Filled 2011-06-03 (×2): qty 5

## 2011-06-03 MED ORDER — ENOXAPARIN SODIUM 80 MG/0.8ML ~~LOC~~ SOLN
1.0000 mg/kg | Freq: Once | SUBCUTANEOUS | Status: AC
  Administered 2011-06-03: 60 mg via SUBCUTANEOUS
  Filled 2011-06-03: qty 0.8

## 2011-06-03 MED ORDER — SENNOSIDES-DOCUSATE SODIUM 8.6-50 MG PO TABS
1.0000 | ORAL_TABLET | Freq: Every evening | ORAL | Status: DC | PRN
Start: 2011-06-03 — End: 2011-06-09

## 2011-06-03 MED ORDER — HYDROCODONE-ACETAMINOPHEN 5-325 MG PO TABS
1.0000 | ORAL_TABLET | ORAL | Status: DC | PRN
  Filled 2011-06-03: qty 1

## 2011-06-03 MED ORDER — ALBUTEROL SULFATE (5 MG/ML) 0.5% IN NEBU
2.5000 mg | INHALATION_SOLUTION | RESPIRATORY_TRACT | Status: DC | PRN
  Administered 2011-06-06: 2.5 mg via RESPIRATORY_TRACT
  Filled 2011-06-03 (×3): qty 0.5

## 2011-06-03 MED ORDER — LISINOPRIL 5 MG PO TABS
5.0000 mg | ORAL_TABLET | ORAL | Status: DC
  Administered 2011-06-04 – 2011-06-09 (×6): 5 mg via ORAL
  Filled 2011-06-03 (×6): qty 1

## 2011-06-03 MED ORDER — POLYVINYL ALCOHOL 1.4 % OP SOLN
1.0000 [drp] | Freq: Two times a day (BID) | OPHTHALMIC | Status: DC
  Administered 2011-06-04 – 2011-06-09 (×11): 1 [drp] via OPHTHALMIC
  Filled 2011-06-03 (×2): qty 15

## 2011-06-03 MED ORDER — CLINDAMYCIN PHOSPHATE 900 MG/50ML IV SOLN
900.0000 mg | Freq: Once | INTRAVENOUS | Status: AC
  Administered 2011-06-03: 900 mg via INTRAVENOUS
  Filled 2011-06-03: qty 50

## 2011-06-03 MED ORDER — MORPHINE SULFATE 2 MG/ML IJ SOLN: 1.0000 mg | INTRAMUSCULAR | Status: DC | PRN

## 2011-06-03 NOTE — ED Provider Notes (Signed)
History     CSN: [id number] Arrival date & time: 06/03/2011  6:22 PM  Chief Complaint  Patient presents with  . Leg Pain   The history is provided by the patient and a relative.   Pt with left hip ORIF approx 10 days ago, today with new swelling and redness of left leg and foot. Pt on prophylaxis dose lovenox post surgery. No fever or chills. Pt without complaints. Daughter is nurse and is concerned about the possibility of both DVT and infection. No vomiting. No chest pain, abd pain or SOB. No other complaints. Denies tenderness. Nothing improves or worsens. Symptoms are mild. Pain is an ache  Past Medical History  Diagnosis Date  . Hypertension     Past Surgical History  Procedure Date  . Orif hip fracture 05/23/2011    Procedure: OPEN REDUCTION INTERNAL FIXATION HIP;  Surgeon: Fuller Canada, MD;  Location: [facility];  Service: Orthopedics;  Laterality: Left;  with Gamma Nail  . Hip arthrotomy     History reviewed. No pertinent family history.  History  Substance Use Topics  . Smoking status: Never Smoker   . Smokeless tobacco: Never Used  . Alcohol Use: No    OB History    Grav Para Term Preterm Abortions TAB SAB Ect Mult Living                  Review of Systems  All other systems reviewed and are negative.    Physical Exam  BP 147/50  Pulse 81  Temp(Src) 98.4 F (36.9 C) (Oral)  Resp 17  Ht 5\' 3"  (1.6 m)  Wt 132 lb (59.875 kg)  BMI 23.38 kg/m2  SpO2 97%  Physical Exam  Nursing note and vitals reviewed. Constitutional: She is oriented to person, place, and time. She appears well-developed and well-nourished. No distress.  HENT:  Head: Normocephalic and atraumatic.  Eyes: EOM are normal.  Neck: Normal range of motion.  Cardiovascular: Normal rate, regular rhythm and normal heart sounds.   Pulmonary/Chest: Effort normal and breath sounds normal.  Abdominal: Soft. She exhibits no distension. There is no tenderness.  Musculoskeletal:       Left lower  extremities with two small wounds of left foot, including the dorsum of the left foot and the left heel. Pt with surrounding erythema and warmth and tenderness and swelling. Left hip incision intact with staples and mild wound erythema. Left lower extremity with significant swelling as compared to right. Normal DP and PT pulses in left foot. Old bruising noted on left medial thigh.   Neurological: She is alert and oriented to person, place, and time.  Skin: Skin is warm and dry.  Psychiatric: She has a normal mood and affect. Judgment normal.    ED Course  Procedures  MDM Concern for cellulitis of left lower extremity as well as possible DVT. Lovenox given in ER. IV clindamycin in ER. Will admit for IV abx and likely [place] in the AM to evaluate for DVT. hgb unchanged from discharge hemoglobin. [facility] to admit    Results for orders placed during the hospital encounter of 06/03/11  CBC      Component Value Range   WBC 14.6 (*) 4.0 - 10.5 (K/uL)   RBC 2.42 (*) 3.87 - 5.11 (MIL/uL)   Hemoglobin 7.3 (*) 12.0 - 15.0 (g/dL)   HCT 40.9 (*) 81.1 - 46.0 (%)   MCV 97.9  78.0 - 100.0 (fL)   MCH 30.2  26.0 - 34.0 (pg)  MCHC 30.8  30.0 - 36.0 (g/dL)   RDW 16.1 (*) 09.6 - 15.5 (%)   Platelets 461 (*) 150 - 400 (K/uL)  BASIC METABOLIC PANEL      Component Value Range   Sodium 137  135 - 145 (mEq/L)   Potassium 3.6  3.5 - 5.1 (mEq/L)   Chloride 104  96 - 112 (mEq/L)   CO2 26  19 - 32 (mEq/L)   Glucose, Bld 126 (*) 70 - 99 (mg/dL)   BUN 22  6 - 23 (mg/dL)   Creatinine, Ser 0.45  0.50 - 1.10 (mg/dL)   Calcium 8.3 (*) 8.4 - 10.5 (mg/dL)   GFR calc non Af Amer >60  >60 (mL/min)   GFR calc Af Amer >60  >60 (mL/min)  PROTIME-INR      Component Value Range   Prothrombin Time 13.4  11.6 - 15.2 (seconds)   INR 1.00  0.00 - 1.49   MRSA PCR SCREENING      Component Value Range   MRSA by PCR POSITIVE (*) NEGATIVE   COMPREHENSIVE METABOLIC PANEL      Component Value Range   Sodium 138  135  - 145 (mEq/L)   Potassium 3.5  3.5 - 5.1 (mEq/L)   Chloride 108  96 - 112 (mEq/L)   CO2 26  19 - 32 (mEq/L)   Glucose, Bld 97  70 - 99 (mg/dL)   BUN 18  6 - 23 (mg/dL)   Creatinine, Ser 4.09  0.50 - 1.10 (mg/dL)   Calcium 7.9 (*) 8.4 - 10.5 (mg/dL)   Total Protein 5.5 (*) 6.0 - 8.3 (g/dL)   Albumin 1.9 (*) 3.5 - 5.2 (g/dL)   AST 12  0 - 37 (U/L)   ALT 8  0 - 35 (U/L)   Alkaline Phosphatase 142 (*) 39 - 117 (U/L)   Total Bilirubin 0.4  0.3 - 1.2 (mg/dL)   GFR calc non Af Amer >60  >60 (mL/min)   GFR calc Af Amer >60  >60 (mL/min)  CBC      Component Value Range   WBC 11.1 (*) 4.0 - 10.5 (K/uL)   RBC 2.16 (*) 3.87 - 5.11 (MIL/uL)   Hemoglobin 6.7 (*) 12.0 - 15.0 (g/dL)   HCT 81.1 (*) 91.4 - 46.0 (%)   MCV 95.8  78.0 - 100.0 (fL)   MCH 31.0  26.0 - 34.0 (pg)   MCHC 32.4  30.0 - 36.0 (g/dL)   RDW 78.2 (*) 95.6 - 15.5 (%)   Platelets 406 (*) 150 - 400 (K/uL)  RETICULOCYTES      Component Value Range   Retic Ct Pct 5.8 (*) 0.4 - 3.1 (%)   RBC. 2.16 (*) 3.87 - 5.11 (MIL/uL)   Retic Count, Manual 125.3  19.0 - 186.0 (K/uL)  TYPE AND SCREEN      Component Value Range   ABO/RH(D) O POS     Antibody Screen NEG     Sample Expiration 06/07/2011     Unit Number [id number]     Blood Component Type RED CELLS,LR     Unit division 00     Status of Unit ISSUED     Transfusion Status OK TO TRANSFUSE     Crossmatch Result Compatible    PREPARE RBC (CROSSMATCH)      Component Value Range   Order Confirmation ORDER PROCESSED BY BLOOD BANK       Lyanne Co, MD 06/04/11 [id number]

## 2011-06-03 NOTE — ED Notes (Signed)
Pt had hip surgery last Friday now left leg is hot to the touch concern at nursing home of blood clot

## 2011-06-03 NOTE — H&P (Signed)
Wendy Coleman is an 77 y.o. female.  Her primary care physician is Dr. Gerda Diss. She was operated on by Dr. Romeo Apple recently for left hip.  Chief Complaint: Swelling of the left leg  HPI: This is a 77 year old, Caucasian female, who underwent a recent left hip arthroplasty a few weeks ago. This was done by Dr. Valentina Shaggy. Patient lives at [facility] and she was discharged back to that facility. She has been on prophylactic doses of Lovenox in the nursing facility. According to her caregivers the, who are her power of attorney, (Apparently, patient does not have any close relatives) patient has been doing well, and she's been ambulating with the help of physical therapy. She's had some open sores on the left foot for a while. This is the recently been complicated by onset of redness and warmth. And then today they noticed that her left leg was swollen. She did have some postoperative bleeding in the left thigh. She has hematoma of left thigh. Bruising is old. Patient denies any pain in that area. There's been no history of chest pain or shortness of breath, cough. Because of the patient's dementia history is limited. No known history of fever or chills.   Prior to Admission medications   Medication Sig Start Date End Date Taking? Authorizing Provider  acetaminophen (TYLENOL) 500 MG tablet Take 1,000 mg by mouth every 4 (four) hours as needed. For pain not exceeding 2 doses with 24 hours    Yes Historical Provider, MD  bimatoprost (LUMIGAN) 0.01 % SOLN Apply 1 drop to eye at bedtime.     Yes Historical Provider, MD  citalopram (CELEXA) 10 MG tablet Take 10 mg by mouth every morning.     Yes Historical Provider, MD  docusate sodium (DULCOLAX) 100 MG capsule Take 100 mg by mouth daily.     Yes Historical Provider, MD  lisinopril (PRINIVIL,ZESTRIL) 5 MG tablet Take 5 mg by mouth every morning.     Yes Historical Provider, MD  OLANZapine (ZYPREXA) 2.5 MG tablet Take 2.5 mg by mouth at bedtime.      Yes Historical Provider, MD  Polyethyl Glycol-Propyl Glycol (SYSTANE ULTRA OP) Apply 1 drop to eye 2 (two) times daily.     Yes Historical Provider, MD  polyethylene glycol (MIRALAX / GLYCOLAX) packet Take 17 g by mouth daily. Mix 17g in 8oz water daily     Yes Historical Provider, MD  enoxaparin (LOVENOX) 30 MG/0.3ML SOLN Inject 0.3 mLs (30 mg total) into the skin daily. 05/27/11   Fuller Canada, MD  HYDROcodone-acetaminophen (NORCO) 5-325 MG per tablet Take 1 tablet by mouth every 4 (four) hours as needed. 05/27/11 06/06/11  Fuller Canada, MD  senna-docusate (SENOKOT-S) 8.6-50 MG per tablet Take 1 tablet by mouth at bedtime as needed for constipation. 05/27/11 05/26/12  Fuller Canada, MD    Allergies: No Known Allergies  Past Medical History  Diagnosis Date  . Hypertension     Past Surgical History  Procedure Date  . Orif hip fracture 05/23/2011    Procedure: OPEN REDUCTION INTERNAL FIXATION HIP;  Surgeon: Fuller Canada, MD;  Location: [facility];  Service: Orthopedics;  Laterality: Left;  with Gamma Nail  . Hip arthrotomy   . Abdominal hysterectomy     Social History:  reports that she has never smoked. She has never used smokeless tobacco. She reports that she does not drink alcohol or use illicit drugs.  Family History: History reviewed. No pertinent family history.  Review of Systems  Unable to perform ROS: dementia    Blood pressure 147/50, pulse 81, temperature 98.4 F (36.9 C), temperature source Oral, resp. rate 17, height 5\' 3"  (1.6 m), weight 59.875 kg (132 lb), SpO2 97.00%. Physical Exam  Vitals reviewed. Constitutional: She appears well-developed. No distress.  HENT:  Head: Normocephalic and atraumatic.  Mouth/Throat: Oropharynx is clear and moist. No oropharyngeal exudate.  Eyes: Pupils are equal, round, and reactive to light. Right eye exhibits no discharge. Left eye exhibits no discharge. No scleral icterus.  Neck: Normal range of motion. Neck supple. No JVD  present. No tracheal deviation present. No thyromegaly present.  Cardiovascular: Normal rate and regular rhythm.  Exam reveals no friction rub.   Murmur heard. Pulmonary/Chest: Effort normal and breath sounds normal. No stridor. She has no wheezes. She has no rales. She exhibits no tenderness.  Abdominal: Soft. Bowel sounds are normal. She exhibits no distension and no mass. There is no tenderness. There is no rebound and no guarding.  Musculoskeletal:       Left lower leg: She exhibits swelling and edema.       Left foot: She exhibits swelling.  Lymphadenopathy:    She has no cervical adenopathy.  Neurological: She is alert.  Skin: Rash noted. She is not diaphoretic. There is erythema.        Results for orders placed during the hospital encounter of 06/03/11 (from the past 48 hour(s))  CBC     Status: Abnormal   Collection Time   06/03/11  7:22 PM      Component Value Range Comment   WBC 14.6 (*) 4.0 - 10.5 (K/uL)    RBC 2.42 (*) 3.87 - 5.11 (MIL/uL)    Hemoglobin 7.3 (*) 12.0 - 15.0 (g/dL)    HCT 16.1 (*) 09.6 - 46.0 (%)    MCV 97.9  78.0 - 100.0 (fL)    MCH 30.2  26.0 - 34.0 (pg)    MCHC 30.8  30.0 - 36.0 (g/dL)    RDW 04.5 (*) 40.9 - 15.5 (%)    Platelets 461 (*) 150 - 400 (K/uL)   BASIC METABOLIC PANEL     Status: Abnormal   Collection Time   06/03/11  7:22 PM      Component Value Range Comment   Sodium 137  135 - 145 (mEq/L)    Potassium 3.6  3.5 - 5.1 (mEq/L)    Chloride 104  96 - 112 (mEq/L)    CO2 26  19 - 32 (mEq/L)    Glucose, Bld 126 (*) 70 - 99 (mg/dL)    BUN 22  6 - 23 (mg/dL)    Creatinine, Ser 8.11  0.50 - 1.10 (mg/dL)    Calcium 8.3 (*) 8.4 - 10.5 (mg/dL)    GFR calc non Af Amer >60  >60 (mL/min)    GFR calc Af Amer >60  >60 (mL/min)   PROTIME-INR     Status: Normal   Collection Time   06/03/11  7:22 PM      Component Value Range Comment   Prothrombin Time 13.4  11.6 - 15.2 (seconds)    INR 1.00  0.00 - 1.49     No results  found.   Assessment/Plan  Principal Problem:  *Cellulitis of foot Active Problems:  Dementia  Pressure sore on heel  Leg swelling  HTN (hypertension)   #1 cellulitis of the left foot: This will be treated with Unasyn. Will ask the wound care nurse to take a look at her  left foot.  #2 swelling of the left leg: Because of her recent hospitalization and surgery there is always a concern about DVT. So, we will order a venous Doppler of her lower extremities. Till the results are available we will put her on full dose Lovenox.  #3 recent hip surgery: We'll inform Dr. Romeo Apple of this admission. He doesn't necessarily need to be consulted.  #4 history of hypertension. Continue with lisinopril.   #5 anemia: This hemoglobin count is same as what it was a few days ago. We will check an anemia panel. I believe she may have lost some blood postoperatively into the left thigh area, and that's probably causing the anemia.  #6 dementia: Monitor closely.   #7 pressure ulcers on her left foot: Wound care consultation will be obtained.  #8 glaucoma: Continue with eye drops.  CODE STATUS was discussed with the patient's caregivers and the were not able to decide what her CODE STATUS should be. They said that they will discuss this and will get back to [place].  Further management decisions will depend on results of further testing and patient's response to treatment.  Eidan Muellner 06/03/2011, 9:14 PM

## 2011-06-04 ENCOUNTER — Telehealth: Payer: Self-pay | Admitting: Orthopedic Surgery

## 2011-06-04 ENCOUNTER — Inpatient Hospital Stay (HOSPITAL_COMMUNITY): Payer: Medicare Other

## 2011-06-04 DIAGNOSIS — L02419 Cutaneous abscess of limb, unspecified: Secondary | ICD-10-CM

## 2011-06-04 DIAGNOSIS — S72143A Displaced intertrochanteric fracture of unspecified femur, initial encounter for closed fracture: Secondary | ICD-10-CM

## 2011-06-04 LAB — COMPREHENSIVE METABOLIC PANEL
ALT: 8 U/L (ref 0–35)
AST: 12 U/L (ref 0–37)
Calcium: 7.9 mg/dL — ABNORMAL LOW (ref 8.4–10.5)
GFR calc Af Amer: >60 mL/min (ref >60)
Sodium: 138 mEq/L (ref 135–145)
Total Protein: 5.5 g/dL — ABNORMAL LOW (ref 6.0–8.3)

## 2011-06-04 LAB — IRON AND TIBC
Saturation Ratios: 13 % — ABNORMAL LOW (ref 20–55)
UIBC: 188 ug/dL (ref 125–400)

## 2011-06-04 LAB — CBC
HCT: 20.7 % — ABNORMAL LOW (ref 36.0–46.0)
Hemoglobin: 6.7 g/dL — CL (ref 12.0–15.0)
MCH: 31 pg (ref 26.0–34.0)
MCHC: 32.4 g/dL (ref 30.0–36.0)

## 2011-06-04 LAB — MRSA PCR SCREENING: MRSA by PCR: POSITIVE — AB

## 2011-06-04 LAB — RETICULOCYTES: Retic Count, Absolute: 125.3 10*3/uL (ref 19.0–186.0)

## 2011-06-04 LAB — FOLATE: Folate: 14.6 ng/mL

## 2011-06-04 MED ORDER — PROSOURCE NO CARB PO LIQD
30.0000 mL | Freq: Two times a day (BID) | ORAL | Status: DC
  Administered 2011-06-04 – 2011-06-05 (×2): 30 mL via ORAL
  Filled 2011-06-04 (×6): qty 30

## 2011-06-04 MED ORDER — COLLAGENASE 250 UNIT/GM EX OINT
TOPICAL_OINTMENT | Freq: Every day | CUTANEOUS | Status: DC
  Administered 2011-06-04 – 2011-06-09 (×6): via TOPICAL
  Filled 2011-06-04: qty 30

## 2011-06-04 MED ORDER — SODIUM CHLORIDE 0.9 % IV SOLN
1.5000 g | Freq: Four times a day (QID) | INTRAVENOUS | Status: DC
  Administered 2011-06-04 – 2011-06-08 (×16): 1.5 g via INTRAVENOUS
  Filled 2011-06-04 (×22): qty 1.5

## 2011-06-04 MED ORDER — THERA M PLUS PO TABS
1.0000 | ORAL_TABLET | Freq: Every day | ORAL | Status: DC
  Administered 2011-06-04 – 2011-06-09 (×6): 1 via ORAL
  Filled 2011-06-04 (×6): qty 1

## 2011-06-04 MED ORDER — ENSURE CLINICAL ST REVIGOR PO LIQD
237.0000 mL | Freq: Three times a day (TID) | ORAL | Status: DC
  Administered 2011-06-04 – 2011-06-09 (×9): 237 mL via ORAL

## 2011-06-04 MED ORDER — FUROSEMIDE 10 MG/ML IJ SOLN: 20.0000 mg | Freq: Once | INTRAMUSCULAR | Status: DC

## 2011-06-04 MED ORDER — FUROSEMIDE 10 MG/ML IJ SOLN
20.0000 mg | Freq: Once | INTRAMUSCULAR | Status: AC
  Administered 2011-06-04: 20 mg via INTRAVENOUS
  Filled 2011-06-04: qty 2

## 2011-06-04 MED ORDER — PROMETHAZINE HCL 25 MG/ML IJ SOLN
INTRAMUSCULAR | Status: AC
  Filled 2011-06-04: qty 1

## 2011-06-04 MED ORDER — SODIUM CHLORIDE 0.9 % IV SOLN
1.5000 g | Freq: Once | INTRAVENOUS | Status: AC
  Administered 2011-06-04: 1.5 g via INTRAVENOUS
  Filled 2011-06-04: qty 1.5

## 2011-06-04 NOTE — Progress Notes (Signed)
Staples removed and transparent dressing applied,patient tolerated well.

## 2011-06-04 NOTE — Consult Note (Signed)
Reason for Consult:fracture left hip s/p Gamma Nail  Referring Physician: Dr Barnie Del  Wendy Coleman is an 77 y.o. female.  HPI: Status post left Gamma nailing and August 24 presents back with progression of a heel ulcer.  Past Medical History  Diagnosis Date  . Hypertension     Past Surgical History  Procedure Date  . Orif hip fracture 05/23/2011    Procedure: OPEN REDUCTION INTERNAL FIXATION HIP;  Surgeon: Fuller Canada, MD;  Location: [facility];  Service: Orthopedics;  Laterality: Left;  with Gamma Nail  . Hip arthrotomy   . Abdominal hysterectomy     History reviewed. No pertinent family history.  Social History:  reports that she has never smoked. She has never used smokeless tobacco. She reports that she does not drink alcohol or use illicit drugs.  Allergies: No Known Allergies  Medications: I have reviewed the patient's current medications.  Results for orders placed during the hospital encounter of 06/03/11 (from the past 48 hour(s))  CBC     Status: Abnormal   Collection Time   06/03/11  7:22 PM      Component Value Range Comment   WBC 14.6 (*) 4.0 - 10.5 (K/uL)    RBC 2.42 (*) 3.87 - 5.11 (MIL/uL)    Hemoglobin 7.3 (*) 12.0 - 15.0 (g/dL)    HCT 16.1 (*) 09.6 - 46.0 (%)    MCV 97.9  78.0 - 100.0 (fL)    MCH 30.2  26.0 - 34.0 (pg)    MCHC 30.8  30.0 - 36.0 (g/dL)    RDW 04.5 (*) 40.9 - 15.5 (%)    Platelets 461 (*) 150 - 400 (K/uL)   BASIC METABOLIC PANEL     Status: Abnormal   Collection Time   06/03/11  7:22 PM      Component Value Range Comment   Sodium 137  135 - 145 (mEq/L)    Potassium 3.6  3.5 - 5.1 (mEq/L)    Chloride 104  96 - 112 (mEq/L)    CO2 26  19 - 32 (mEq/L)    Glucose, Bld 126 (*) 70 - 99 (mg/dL)    BUN 22  6 - 23 (mg/dL)    Creatinine, Ser 8.11  0.50 - 1.10 (mg/dL)    Calcium 8.3 (*) 8.4 - 10.5 (mg/dL)    GFR calc non Af Amer >60  >60 (mL/min)    GFR calc Af Amer >60  >60 (mL/min)   PROTIME-INR     Status: Normal   Collection Time   06/03/11  7:22 PM      Component Value Range Comment   Prothrombin Time 13.4  11.6 - 15.2 (seconds)    INR 1.00  0.00 - 1.49    CULTURE, BLOOD (ROUTINE X 2)     Status: Normal (Preliminary result)   Collection Time   06/03/11  7:24 PM      Component Value Range Comment   Specimen Description BLOOD LEFT ANTECUBITAL      Special Requests BOTTLES DRAWN AEROBIC ONLY 5CC      Culture NO GROWTH 1 DAY      Report Status PENDING     CULTURE, BLOOD (ROUTINE X 2)     Status: Normal (Preliminary result)   Collection Time   06/03/11  8:30 PM      Component Value Range Comment   Specimen Description BLOOD LEFT FOREARM DRAWN BY RN      Special Requests  Value: BOTTLES DRAWN AEROBIC AND ANAEROBIC AEB=6CC ANA=8CC   Culture NO GROWTH 1 DAY      Report Status PENDING     MRSA PCR SCREENING     Status: Abnormal   Collection Time   06/04/11  1:24 AM      Component Value Range Comment   MRSA by PCR POSITIVE (*) NEGATIVE    COMPREHENSIVE METABOLIC PANEL     Status: Abnormal   Collection Time   06/04/11  5:40 AM      Component Value Range Comment   Sodium 138  135 - 145 (mEq/L)    Potassium 3.5  3.5 - 5.1 (mEq/L)    Chloride 108  96 - 112 (mEq/L)    CO2 26  19 - 32 (mEq/L)    Glucose, Bld 97  70 - 99 (mg/dL)    BUN 18  6 - 23 (mg/dL)    Creatinine, Ser 1.61  0.50 - 1.10 (mg/dL)    Calcium 7.9 (*) 8.4 - 10.5 (mg/dL)    Total Protein 5.5 (*) 6.0 - 8.3 (g/dL)    Albumin 1.9 (*) 3.5 - 5.2 (g/dL)    AST 12  0 - 37 (U/L)    ALT 8  0 - 35 (U/L)    Alkaline Phosphatase 142 (*) 39 - 117 (U/L)    Total Bilirubin 0.4  0.3 - 1.2 (mg/dL)    GFR calc non Af Amer >60  >60 (mL/min)    GFR calc Af Amer >60  >60 (mL/min)   CBC     Status: Abnormal   Collection Time   06/04/11  5:40 AM      Component Value Range Comment   WBC 11.1 (*) 4.0 - 10.5 (K/uL)    RBC 2.16 (*) 3.87 - 5.11 (MIL/uL)    Hemoglobin 6.7 (*) 12.0 - 15.0 (g/dL)    HCT 09.6 (*) 04.5 - 46.0 (%)    MCV 95.8  78.0 - 100.0 (fL)    MCH 31.0  26.0  - 34.0 (pg)    MCHC 32.4  30.0 - 36.0 (g/dL)    RDW 40.9 (*) 81.1 - 15.5 (%)    Platelets 406 (*) 150 - 400 (K/uL)   FERRITIN     Status: Normal   Collection Time   06/04/11  5:40 AM      Component Value Range Comment   Ferritin 121  10 - 291 (ng/mL)   FOLATE     Status: Normal   Collection Time   06/04/11  5:40 AM      Component Value Range Comment   Folate 14.6     IRON AND TIBC     Status: Abnormal   Collection Time   06/04/11  5:40 AM      Component Value Range Comment   Iron 28 (*) 42 - 135 (ug/dL)    TIBC 914 (*) 782 - 470 (ug/dL)    Saturation Ratios 13 (*) 20 - 55 (%)    UIBC 188  125 - 400 (ug/dL)   RETICULOCYTES     Status: Abnormal   Collection Time   06/04/11  5:40 AM      Component Value Range Comment   Retic Ct Pct 5.8 (*) 0.4 - 3.1 (%)    RBC. 2.16 (*) 3.87 - 5.11 (MIL/uL)    Retic Count, Manual 125.3  19.0 - 186.0 (K/uL)   TSH     Status: Normal   Collection Time   06/04/11  5:40 AM  Component Value Range Comment   TSH 0.987  0.350 - 4.500 (uIU/mL)   VITAMIN B12     Status: Abnormal   Collection Time   06/04/11  5:40 AM      Component Value Range Comment   Vitamin B-12 135 (*) 211 - 911 (pg/mL)   TYPE AND SCREEN     Status: Normal (Preliminary result)   Collection Time   06/04/11  7:09 AM      Component Value Range Comment   ABO/RH(D) O POS      Antibody Screen NEG      Sample Expiration 06/07/2011      Unit Number [id number]      Blood Component Type RED CELLS,LR      Unit division 00      Status of Unit ISSUED      Transfusion Status OK TO TRANSFUSE      Crossmatch Result Compatible     PREPARE RBC (CROSSMATCH)     Status: Normal   Collection Time   06/04/11  7:14 AM      Component Value Range Comment   Order Confirmation ORDER PROCESSED BY BLOOD BANK       US Venous Img Lower Bilateral  06/04/2011  *RADIOLOGY REPORT*  Clinical Data: Bilateral lower extremity swelling.  Recent left hip surgery.  VENOUS DUPLEX ULTRASOUND OF BILATERAL LOWER EXTREMITIES   Technique:  Gray-scale sonography with graded compression, as well as color Doppler and duplex ultrasound, were performed to evaluate the deep venous system of both lower extremities from the level of the common femoral vein through the popliteal and proximal calf veins.  Spectral Doppler was utilized to evaluate flow at rest and with distal augmentation maneuvers.  Comparison:  None.  Findings: There is nonocclusive deep venous thrombosis of the left common femoral vein and of the left superficial femoral vein. The other veins of the left leg are normal.  The deep venous system of the right lower extremity is normal.  IMPRESSION:  1.  Nonocclusive deep venous thrombosis of the left common femoral vein and left superficial femoral vein. 2.  Otherwise normal exam.  Original Report Authenticated By: Gwynn Burly, M.D.    ROS Blood pressure 161/70, pulse 80, temperature 98.3 F (36.8 C), temperature source Oral, resp. rate 20, height 5\' 3"  (1.6 m), weight 52.2 kg (115 lb 1.3 oz), SpO2 96.00%. Physical Exam  Constitutional: She appears well-developed and well-nourished.  HENT:  Head: Normocephalic.  Neck: Normal range of motion.  Cardiovascular: Normal rate.   Respiratory: Effort normal.  Musculoskeletal:       Left hip: She exhibits decreased range of motion, decreased strength, tenderness, bony tenderness and swelling. She exhibits no deformity.  Neurological: She is alert. She exhibits normal muscle tone.   see below   Assessment/Plan: Evaluation of the incision reveals that the surgical staples can be removed.  There's no sign of DVT patient ultrasound.  Bleeding is swelling thought to be secondary to Lovenox recommend stopping Lovenox for couple of days until the hemoglobin stabilizes. The patient can weight-bear as tolerated depending on the status of the pressure ulcer.  Fuller Canada 06/04/2011, 6:03 PM

## 2011-06-04 NOTE — Consult Note (Signed)
Pt recently in hospital for ORIF on 8/24.  Stage 2 blister to left heel noted 8/25, and wound consult was performed on 8/27.  Pt was also noted to have dk purple deep tissue injury to anterior foot at that time.  Upon this admission, pt has increased erythemia and tenderness according to progress notes and antibiotics have been started.  Anterior foot with unstageable wound 2,5X5 cm yellow dry adhered slough 50%, and 50% dry eschar.  No odor or drainage.  Left posterior heel with unstageable  wound 2X3, yellow-red wound bed, sm tan drainage, no odor.  Foot warm to touch with faint pedal pulse. Pressure ulcers are present on admission and patient is scheduled to have doppler study to R/O possible DVT to left leg. Plan:  Pt has Prevalon boot from previous admission for pressure reduction.  Santyl for chemical debridement of nonviable tissue to foot and heel.  Please reconsult if further assistance is needed. Thank-you, Cammie Mcgee, MSN, [employer]

## 2011-06-04 NOTE — Progress Notes (Signed)
UR Chart Review Completed  

## 2011-06-04 NOTE — Progress Notes (Signed)
INITIAL ADULT NUTRITION ASSESSMENT Date: 06/04/2011   Time: 11:55 AM Reason for Assessment: Pressure ulcers  ASSESSMENT: Female 77 y.o.  Dx: Cellulitis of foot  Hx:  Past Medical History  Diagnosis Date  . Hypertension     Related Meds:  Scheduled Meds:   . ampicillin-sulbactam (UNASYN) IV  1.5 g Intravenous Once  . ampicillin-sulbactam (UNASYN) IV  1.5 g Intravenous Q6H  . bimatoprost  1 drop Both Eyes QHS  . citalopram  10 mg Oral QAM  . clindamycin  900 mg Intravenous Once  . collagenase   Topical Daily  . enoxaparin  1 mg/kg Subcutaneous Q12H  . enoxaparin  1 mg/kg Subcutaneous Once  . furosemide  20 mg Intravenous Once  . lisinopril  5 mg Oral QAM  . OLANZapine  2.5 mg Oral QHS  . polyethylene glycol  17 g Oral Daily  . polyvinyl alcohol  1 drop Both Eyes BID  . DISCONTD: promethazine       Continuous Infusions:   . sodium chloride 50 mL/hr at 06/03/11 2345   PRN Meds:.acetaminophen, acetaminophen, albuterol, HYDROcodone-acetaminophen, morphine, ondansetron (ZOFRAN) IV, ondansetron, senna-docusate, sodium phosphate   Ht: 5\' 3"  (160 cm)  Wt: 115 lb 1.3 oz (52.2 kg)  Ideal Wt: 52.4 kg  % Ideal Wt: 100%  Usual Wt: unable to obtain % Usual Wt: unable to obtain  Body mass index is 20.39 kg/(m^2).  Food/Nutrition Related Hx: Pt is a resident of [address]. Pt s/p hip sx 8/24. Pt with multiple pressure ulcers on lt foot- stage 2 blister on lt heel, anterior foot unstagable wound, and lt posterior heel unstagable wound. No family present at time of visit. Unable to obtain hx from pt due to dementia. No PO% information available at this time.  Labs:  CBC    Component Value Date/Time   WBC 11.1* 06/04/2011 0540   RBC 2.16* 06/04/2011 0540   HGB 6.7* 06/04/2011 0540   HCT 20.7* 06/04/2011 0540   PLT 406* 06/04/2011 0540   MCV 95.8 06/04/2011 0540   MCH 31.0 06/04/2011 0540   MCHC 32.4 06/04/2011 0540   RDW 16.9* 06/04/2011 0540   LYMPHSABS 2.2 05/21/2011 1618   MONOABS 0.7  05/21/2011 1618   EOSABS 0.1 05/21/2011 1618   BASOSABS 0.0 05/21/2011 1618     CMP     Component Value Date/Time   NA 138 06/04/2011 0540   K 3.5 06/04/2011 0540   CL 108 06/04/2011 0540   CO2 26 06/04/2011 0540   GLUCOSE 97 06/04/2011 0540   BUN 18 06/04/2011 0540   CREATININE 0.72 06/04/2011 0540   CALCIUM 7.9* 06/04/2011 0540   PROT 5.5* 06/04/2011 0540   ALBUMIN 1.9* 06/04/2011 0540   AST 12 06/04/2011 0540   ALT 8 06/04/2011 0540   ALKPHOS 142* 06/04/2011 0540   BILITOT 0.4 06/04/2011 0540   GFRNONAA >60 06/04/2011 0540   GFRAA >60 06/04/2011 0540      Intake:   Intake/Output Summary (Last 24 hours) at 06/04/11 1349 Last data filed at 06/04/11 1200  Gross per 24 hour  Intake   1634 ml  Output   1050 ml  Net    584 ml    Diet Order: General  Supplements/Tube Feeding: nome at this time  IVF:    sodium chloride Last Rate: 50 mL/hr at 06/03/11 2345    Estimated Nutritional Needs:   Kcal:1560-1820 kcals daily Protein:65-78 grams protein daily Fluid:1.6-1.8 L fluid daily  NUTRITION DIAGNOSIS: -Increased nutrient needs (NI-5.1).  Status: Ongoing  RELATED TO: increased protein needs for pressure ulcers  AS EVIDENCE BY: pt with multiple pressure ulcers on lt foot.  MONITORING/EVALUATION(Goals): -Pt will meet >75% of estimated energy needs -Pt will maintain current wt of 115#  EDUCATION NEEDS: -Education not appropriate at this time due to dementia  INTERVENTION: Add Ensure Clinical Strength TID, 30 ml Prostat BID, and 1 MVI tablet daily.   Dietitian [phone]  DOCUMENTATION CODES Per approved criteria  -Non-severe (moderate) malnutrition in the context of acute illness or injury    Orlene Plum 06/04/2011, 1:43 PM

## 2011-06-04 NOTE — Progress Notes (Signed)
CRITICAL VALUE ALERT  Critical value received:  6.7 hgb  Date of notification:  06/04/11  Time of notification:  0627  Critical value read back: yes  Nurse who received alert:  Foye Deer RN MD notified (1st page):  Dr. Rito Ehrlich  Time of first page:  0628  MD notified (2nd page): Dr. Rito Ehrlich  Time of second page: [phone]  Responding MD:  Dr. Rito Ehrlich  Time MD responded:  [phone] no new orders given at this time

## 2011-06-04 NOTE — Progress Notes (Signed)
Physical Therapy Evaluation Patient Name: Wendy Coleman [id number] Date: 06/04/2011 Problem List:  Patient Active Problem List  Diagnoses  . Hip fracture, intertrochanteric  . Dementia  . Pressure sore on heel  . Cellulitis of foot  . Leg swelling  . HTN (hypertension)   Past Medical History:  Past Medical History  Diagnosis Date  . Hypertension    Past Surgical History:  Past Surgical History  Procedure Date  . Orif hip fracture 05/23/2011    Procedure: OPEN REDUCTION INTERNAL FIXATION HIP;  Surgeon: Fuller Canada, MD;  Location: [facility];  Service: Orthopedics;  Laterality: Left;  with Gamma Nail  . Hip arthrotomy   . Abdominal hysterectomy     Precautions/Restrictions  Precautions Precautions: Fall;Other (comment) (Contact) Restrictions Weight Bearing Restrictions: Yes LLE Weight Bearing: Partial weight bearing Prior Functioning  Home Living Type of Home: Skilled Nursing Facility Prior Function Level of Independence: Requires assistive device for independence;Needs assistance with ADLs Driving: No Cognition   Sensation/Coordination   Extremity Assessment RLE Assessment RLE Assessment: Exceptions to [facility] RLE Strength RLE Overall Strength: Deficits LLE Assessment LLE Assessment: Exceptions to [facility] LLE Strength LLE Overall Strength: Deficits;Due to premorbid status (Recent L THR) Mobility (including Balance) Bed Mobility Bed Mobility: Yes Supine to Sit: 2: Max assist Supine to Sit Details (indicate cue type and reason): cueing for motivation and sequencing Sitting - Scoot to Edge of Bed: 3: Mod assist Sitting - Scoot to Edge of Bed Details (indicate cue type and reason): cueing for sequencing and PWB on LLE Sit to Supine - Right: 3: Mod assist Sit to Supine - Right Details (indicate cue type and reason): Assitance for BLE into bed.  cueing for motivation Transfers Transfers: No (Pt declined) Ambulation/Gait Ambulation/Gait: No (Pt declined) Stairs:  No Wheelchair Mobility Wheelchair Mobility: No  Balance Balance Assessed: Yes Static Sitting Balance Static Sitting - Balance Support: Bilateral upper extremity supported;Feet supported Static Sitting - Level of Assistance: 4: Min assist Static Sitting - Comment/# of Minutes: 8 minutes on EOB Exercise  Total Joint Exercises Heel Slides: AROM;Both;5 reps Hip ABduction/ADduction:  (cueing for motivation) General Exercises - Lower Extremity Heel Slides: AROM;Both;5 reps Hip ABduction/ADduction:  (cueing for motivation) Low Level/ICU Exercises Hip ABduction/ADduction:  (cueing for motivation) Heel Slides: AROM;Both;5 reps  End of Session PT - End of Session Equipment Utilized During Treatment: Gait belt Activity Tolerance: Patient limited by fatigue Patient left: in bed;with call bell in reach;with family/visitor present Psychiatrist present) General Behavior During Session: Lethargic Cognition: Impaired, at baseline PT Assessment/Plan/Recommendation PT Assessment Clinical Impression Statement: Pt is lethargic and unwilling to participate in therapy exercises today secondary to fatigue.  Requires much motivation to sit on EOB, but is willing to do so in order to use commode.  Pt unwilling to tranfer from sit to stand.  PT Recommendation/Assessment: Patient will need skilled PT in the acute care venue PT Problem List: Decreased strength;Decreased activity tolerance PT Therapy Diagnosis : Difficulty walking;Generalized weakness PT Plan PT Frequency: Min 3X/week PT Treatment/Interventions: Gait training;Functional mobility training;Therapeutic exercise;Patient/family education PT Recommendation Follow Up Recommendations: 24 hour supervision/assistance;Other (comment) PT Goals  Acute Rehab PT Goals PT Goal Formulation: With patient Pt will go Supine/Side to Sit: with mod assist Pt will Transfer Sit to Stand/Stand to Sit: with mod assist Pt will Ambulate: 1 - 15 feet;with mod  assist Wendy Coleman 06/04/2011, 2:59 PM

## 2011-06-04 NOTE — Consult Note (Signed)
ANTIBIOTIC CONSULT NOTE - INITIAL  Pharmacy Consult for Unasyn Indication: cellulitis  Patient Measurements: Height: 5\' 3"  (160 cm) Weight: 115 lb 1.3 oz (52.2 kg) IBW/kg (Calculated) : 52.4   Vital Signs: Temp: 98.8 F (37.1 C) (09/05 0721) Temp src: Oral (09/04 2330) BP: 144/66 mmHg (09/05 0721) Pulse Rate: 79  (09/05 0721)  Labs:  Basename 06/04/11 0540 06/03/11 1922  WBC 11.1* 14.6*  HGB 6.7* 7.3*  PLT 406* 461*  LABCREA -- --  CREATININE 0.72 0.87  CRCLEARANCE -- --   No results found for this basename: VANCOTROUGH:2,VANCOPEAK:2,VANCORANDOM:2,GENTTROUGH:2,GENTPEAK:2,GENTRANDOM:2,TOBRATROUGH:2,TOBRAPEAK:2,TOBRARND:2,AMIKACINPEAK:2,AMIKACINTROU:2,AMIKACIN:2, in the last 72 hours   Microbiology: Recent Results (from the past 720 hour(s))  URINE CULTURE     Status: Normal   Collection Time   05/21/11  3:55 PM      Component Value Range Status Comment   Specimen Description URINE, CATHETERIZED   Final    Special Requests NONE   Final    Setup Time [id number]   Final    Colony Count NO GROWTH   Final    Culture NO GROWTH   Final    Report Status 05/22/2011 FINAL   Final   MRSA PCR SCREENING     Status: Abnormal   Collection Time   06/04/11  1:24 AM      Component Value Range Status Comment   MRSA by PCR POSITIVE (*) NEGATIVE  Final     Medical History: Past Medical History  Diagnosis Date  . Hypertension    Assessment: Renal fxn OK (77yo F) SCr OK  Goal of Therapy:  Eradicate infection.  Plan:  Unasyn 1.5gm iv q6hrs Monitor SCr and cultures  Margo Aye, Nirvi Boehler A 06/04/2011,8:50 AM

## 2011-06-04 NOTE — Telephone Encounter (Signed)
Called [facility], ph# [phone], to confirm appointment for tomorrow, 06/05/11, for post op visit, staple removal.  Per Alphonzo Lemmings, resident care coordinator, patient is in [facility].  She is post op, open treatment internal fixation of the left hip, done on May 23, 2011.  Please advise.

## 2011-06-04 NOTE — Progress Notes (Signed)
Subjective: This 77 year old lady was admitted with a cellulitis of the left foot and swelling and bruising in the left leg. She is due to have a venous Doppler of the left leg to look for DVT. She is on intravenous Unasyn for her left foot cellulitis. She has been consulted by wound care specialist who has suggested Santyl for chemical debridement of nonviable tissue to the foot and heel.           Physical Exam: Blood pressure 144/66, pulse 79, temperature 98.8 F (37.1 C), temperature source Oral, resp. rate 20, height 5\' 3"  (1.6 m), weight 52.2 kg (115 lb 1.3 oz), SpO2 91.00%. She looks systemically well. She does not appear to be any pain. Her sounds are present and normal. Lung fields are clear. Abdomen is soft and nontender. She does have cellulitis in the left foot with 2 ulcers seen, anterior foot and left posterior heel. She appears to be alert and orientated. I believe she has mild to moderate dementia.   Investigations: Results for orders placed during the hospital encounter of 06/03/11 (from the past 48 hour(s))  CBC     Status: Abnormal   Collection Time   06/03/11  7:22 PM      Component Value Range Comment   WBC 14.6 (*) 4.0 - 10.5 (K/uL)    RBC 2.42 (*) 3.87 - 5.11 (MIL/uL)    Hemoglobin 7.3 (*) 12.0 - 15.0 (g/dL)    HCT 16.1 (*) 09.6 - 46.0 (%)    MCV 97.9  78.0 - 100.0 (fL)    MCH 30.2  26.0 - 34.0 (pg)    MCHC 30.8  30.0 - 36.0 (g/dL)    RDW 04.5 (*) 40.9 - 15.5 (%)    Platelets 461 (*) 150 - 400 (K/uL)   BASIC METABOLIC PANEL     Status: Abnormal   Collection Time   06/03/11  7:22 PM      Component Value Range Comment   Sodium 137  135 - 145 (mEq/L)    Potassium 3.6  3.5 - 5.1 (mEq/L)    Chloride 104  96 - 112 (mEq/L)    CO2 26  19 - 32 (mEq/L)    Glucose, Bld 126 (*) 70 - 99 (mg/dL)    BUN 22  6 - 23 (mg/dL)    Creatinine, Ser 8.11  0.50 - 1.10 (mg/dL)    Calcium 8.3 (*) 8.4 - 10.5 (mg/dL)    GFR calc non Af Amer >60  >60 (mL/min)    GFR calc Af Amer  >60  >60 (mL/min)   PROTIME-INR     Status: Normal   Collection Time   06/03/11  7:22 PM      Component Value Range Comment   Prothrombin Time 13.4  11.6 - 15.2 (seconds)    INR 1.00  0.00 - 1.49    MRSA PCR SCREENING     Status: Abnormal   Collection Time   06/04/11  1:24 AM      Component Value Range Comment   MRSA by PCR POSITIVE (*) NEGATIVE    COMPREHENSIVE METABOLIC PANEL     Status: Abnormal   Collection Time   06/04/11  5:40 AM      Component Value Range Comment   Sodium 138  135 - 145 (mEq/L)    Potassium 3.5  3.5 - 5.1 (mEq/L)    Chloride 108  96 - 112 (mEq/L)    CO2 26  19 - 32 (mEq/L)  Glucose, Bld 97  70 - 99 (mg/dL)    BUN 18  6 - 23 (mg/dL)    Creatinine, Ser 1.61  0.50 - 1.10 (mg/dL)    Calcium 7.9 (*) 8.4 - 10.5 (mg/dL)    Total Protein 5.5 (*) 6.0 - 8.3 (g/dL)    Albumin 1.9 (*) 3.5 - 5.2 (g/dL)    AST 12  0 - 37 (U/L)    ALT 8  0 - 35 (U/L)    Alkaline Phosphatase 142 (*) 39 - 117 (U/L)    Total Bilirubin 0.4  0.3 - 1.2 (mg/dL)    GFR calc non Af Amer >60  >60 (mL/min)    GFR calc Af Amer >60  >60 (mL/min)   CBC     Status: Abnormal   Collection Time   06/04/11  5:40 AM      Component Value Range Comment   WBC 11.1 (*) 4.0 - 10.5 (K/uL)    RBC 2.16 (*) 3.87 - 5.11 (MIL/uL)    Hemoglobin 6.7 (*) 12.0 - 15.0 (g/dL)    HCT 09.6 (*) 04.5 - 46.0 (%)    MCV 95.8  78.0 - 100.0 (fL)    MCH 31.0  26.0 - 34.0 (pg)    MCHC 32.4  30.0 - 36.0 (g/dL)    RDW 40.9 (*) 81.1 - 15.5 (%)    Platelets 406 (*) 150 - 400 (K/uL)   RETICULOCYTES     Status: Abnormal   Collection Time   06/04/11  5:40 AM      Component Value Range Comment   Retic Ct Pct 5.8 (*) 0.4 - 3.1 (%)    RBC. 2.16 (*) 3.87 - 5.11 (MIL/uL)    Retic Count, Manual 125.3  19.0 - 186.0 (K/uL)   TYPE AND SCREEN     Status: Normal (Preliminary result)   Collection Time   06/04/11  7:09 AM      Component Value Range Comment   ABO/RH(D) O POS      Antibody Screen NEG      Sample Expiration 06/07/2011      Unit  Number [id number]      Blood Component Type RED CELLS,LR      Unit division 00      Status of Unit ALLOCATED      Transfusion Status OK TO TRANSFUSE      Crossmatch Result Compatible     PREPARE RBC (CROSSMATCH)     Status: Normal   Collection Time   06/04/11  7:14 AM      Component Value Range Comment   Order Confirmation ORDER PROCESSED BY BLOOD BANK      Recent Results (from the past 240 hour(s))  MRSA PCR SCREENING     Status: Abnormal   Collection Time   06/04/11  1:24 AM      Component Value Range Status Comment   MRSA by PCR POSITIVE (*) NEGATIVE  Final     No results found.    Medications: I have reviewed the patient's current medications.  Impression: 1. Cellulitis of left foot with ulcers to the left foot. 2. Swelling of the left leg, awaiting venous Doppler. 3. Hypertension. 4. Dementia. 5. Anemia, possible acute  blood loss anemia from the bruising.     Plan: 1. Continue intravenous antibiotics. 2. Await venous Doppler in the left leg. 3. Continue local wound care. 4. One unit blood transfusion today.     LOS: 1 day   GOSRANI,NIMISH C 06/04/2011, 9:59 AM

## 2011-06-05 ENCOUNTER — Ambulatory Visit: Payer: Medicare Other | Admitting: Orthopedic Surgery

## 2011-06-05 ENCOUNTER — Inpatient Hospital Stay (HOSPITAL_COMMUNITY): Payer: Medicare Other

## 2011-06-05 LAB — TYPE AND SCREEN
Antibody Screen: NEGATIVE
Unit division: 0

## 2011-06-05 LAB — PROTIME-INR
INR: 1.03 (ref 0.00–1.49)
Prothrombin Time: 13.7 seconds (ref 11.6–15.2)

## 2011-06-05 LAB — CBC
HCT: 29 % — ABNORMAL LOW (ref 36.0–46.0)
Hemoglobin: 9.4 g/dL — ABNORMAL LOW (ref 12.0–15.0)
MCH: 30.8 pg (ref 26.0–34.0)
MCHC: 32.4 g/dL (ref 30.0–36.0)
MCV: 95.1 fL (ref 78.0–100.0)

## 2011-06-05 LAB — COMPREHENSIVE METABOLIC PANEL
BUN: 12 mg/dL (ref 6–23)
Calcium: 8.5 mg/dL (ref 8.4–10.5)
Creatinine, Ser: 0.6 mg/dL (ref 0.50–1.10)
GFR calc Af Amer: >60 mL/min (ref >60)
GFR calc non Af Amer: >60 mL/min (ref >60)
Glucose, Bld: 99 mg/dL (ref 70–99)
Sodium: 142 mEq/L (ref 135–145)
Total Protein: 6.7 g/dL (ref 6.0–8.3)

## 2011-06-05 MED ORDER — SODIUM CHLORIDE 0.9 % IJ SOLN
INTRAMUSCULAR | Status: AC
  Administered 2011-06-05: 09:00:00
  Filled 2011-06-05: qty 3

## 2011-06-05 MED ORDER — SODIUM CHLORIDE 0.9 % IJ SOLN
INTRAMUSCULAR | Status: AC
  Administered 2011-06-05: 08:00:00
  Filled 2011-06-05: qty 10

## 2011-06-05 MED ORDER — WARFARIN SODIUM 7.5 MG PO TABS: 7.5000 mg | ORAL_TABLET | Freq: Once | ORAL | Status: DC

## 2011-06-05 MED ORDER — POTASSIUM CHLORIDE CRYS ER 20 MEQ PO TBCR
40.0000 meq | EXTENDED_RELEASE_TABLET | Freq: Once | ORAL | Status: AC
  Administered 2011-06-05: 40 meq via ORAL
  Filled 2011-06-05: qty 2

## 2011-06-05 MED ORDER — PRO-STAT SUGAR FREE PO LIQD
30.0000 mL | Freq: Two times a day (BID) | ORAL | Status: DC
  Administered 2011-06-05 – 2011-06-09 (×7): 30 mL via ORAL
  Filled 2011-06-05 (×8): qty 30

## 2011-06-05 MED ORDER — WARFARIN SODIUM 2 MG PO TABS
4.0000 mg | ORAL_TABLET | Freq: Once | ORAL | Status: AC
  Administered 2011-06-05: 4 mg via ORAL
  Filled 2011-06-05: qty 2

## 2011-06-05 NOTE — Progress Notes (Signed)
Subjective: This 77 year old lady was admitted with a cellulitis of the left foot and swelling and bruising in the left leg. She does have a DVT in the left leg.          Physical Exam: Blood pressure 176/79, pulse 79, temperature 97.9 F (36.6 C), temperature source Oral, resp. rate 16, height 5\' 3"  (1.6 m), weight 52.2 kg (115 lb 1.3 oz), SpO2 96.00%. She looks systemically well. She does not appear to be any pain. Her sounds are present and normal. Lung fields are clear. Abdomen is soft and nontender. She does have cellulitis in the left foot with 2 ulcers seen, anterior foot and left posterior heel. She appears to be alert and orientated.    Investigations: Results for orders placed during the hospital encounter of 06/03/11 (from the past 48 hour(s))  CBC     Status: Abnormal   Collection Time   06/03/11  7:22 PM      Component Value Range Comment   WBC 14.6 (*) 4.0 - 10.5 (K/uL)    RBC 2.42 (*) 3.87 - 5.11 (MIL/uL)    Hemoglobin 7.3 (*) 12.0 - 15.0 (g/dL)    HCT 16.1 (*) 09.6 - 46.0 (%)    MCV 97.9  78.0 - 100.0 (fL)    MCH 30.2  26.0 - 34.0 (pg)    MCHC 30.8  30.0 - 36.0 (g/dL)    RDW 04.5 (*) 40.9 - 15.5 (%)    Platelets 461 (*) 150 - 400 (K/uL)   BASIC METABOLIC PANEL     Status: Abnormal   Collection Time   06/03/11  7:22 PM      Component Value Range Comment   Sodium 137  135 - 145 (mEq/L)    Potassium 3.6  3.5 - 5.1 (mEq/L)    Chloride 104  96 - 112 (mEq/L)    CO2 26  19 - 32 (mEq/L)    Glucose, Bld 126 (*) 70 - 99 (mg/dL)    BUN 22  6 - 23 (mg/dL)    Creatinine, Ser 8.11  0.50 - 1.10 (mg/dL)    Calcium 8.3 (*) 8.4 - 10.5 (mg/dL)    GFR calc non Af Amer >60  >60 (mL/min)    GFR calc Af Amer >60  >60 (mL/min)   PROTIME-INR     Status: Normal   Collection Time   06/03/11  7:22 PM      Component Value Range Comment   Prothrombin Time 13.4  11.6 - 15.2 (seconds)    INR 1.00  0.00 - 1.49    CULTURE, BLOOD (ROUTINE X 2)     Status: Normal (Preliminary result)   Collection Time   06/03/11  7:24 PM      Component Value Range Comment   Specimen Description BLOOD LEFT ANTECUBITAL      Special Requests BOTTLES DRAWN AEROBIC ONLY 5CC      Culture NO GROWTH 1 DAY      Report Status PENDING     CULTURE, BLOOD (ROUTINE X 2)     Status: Normal (Preliminary result)   Collection Time   06/03/11  8:30 PM      Component Value Range Comment   Specimen Description BLOOD LEFT FOREARM DRAWN BY RN      Special Requests        Value: BOTTLES DRAWN AEROBIC AND ANAEROBIC AEB=6CC ANA=8CC   Culture NO GROWTH 1 DAY      Report Status PENDING     MRSA PCR  SCREENING     Status: Abnormal   Collection Time   06/04/11  1:24 AM      Component Value Range Comment   MRSA by PCR POSITIVE (*) NEGATIVE    COMPREHENSIVE METABOLIC PANEL     Status: Abnormal   Collection Time   06/04/11  5:40 AM      Component Value Range Comment   Sodium 138  135 - 145 (mEq/L)    Potassium 3.5  3.5 - 5.1 (mEq/L)    Chloride 108  96 - 112 (mEq/L)    CO2 26  19 - 32 (mEq/L)    Glucose, Bld 97  70 - 99 (mg/dL)    BUN 18  6 - 23 (mg/dL)    Creatinine, Ser 1.61  0.50 - 1.10 (mg/dL)    Calcium 7.9 (*) 8.4 - 10.5 (mg/dL)    Total Protein 5.5 (*) 6.0 - 8.3 (g/dL)    Albumin 1.9 (*) 3.5 - 5.2 (g/dL)    AST 12  0 - 37 (U/L)    ALT 8  0 - 35 (U/L)    Alkaline Phosphatase 142 (*) 39 - 117 (U/L)    Total Bilirubin 0.4  0.3 - 1.2 (mg/dL)    GFR calc non Af Amer >60  >60 (mL/min)    GFR calc Af Amer >60  >60 (mL/min)   CBC     Status: Abnormal   Collection Time   06/04/11  5:40 AM      Component Value Range Comment   WBC 11.1 (*) 4.0 - 10.5 (K/uL)    RBC 2.16 (*) 3.87 - 5.11 (MIL/uL)    Hemoglobin 6.7 (*) 12.0 - 15.0 (g/dL)    HCT 09.6 (*) 04.5 - 46.0 (%)    MCV 95.8  78.0 - 100.0 (fL)    MCH 31.0  26.0 - 34.0 (pg)    MCHC 32.4  30.0 - 36.0 (g/dL)    RDW 40.9 (*) 81.1 - 15.5 (%)    Platelets 406 (*) 150 - 400 (K/uL)   FERRITIN     Status: Normal   Collection Time   06/04/11  5:40 AM      Component  Value Range Comment   Ferritin 121  10 - 291 (ng/mL)   FOLATE     Status: Normal   Collection Time   06/04/11  5:40 AM      Component Value Range Comment   Folate 14.6     IRON AND TIBC     Status: Abnormal   Collection Time   06/04/11  5:40 AM      Component Value Range Comment   Iron 28 (*) 42 - 135 (ug/dL)    TIBC 914 (*) 782 - 470 (ug/dL)    Saturation Ratios 13 (*) 20 - 55 (%)    UIBC 188  125 - 400 (ug/dL)   RETICULOCYTES     Status: Abnormal   Collection Time   06/04/11  5:40 AM      Component Value Range Comment   Retic Ct Pct 5.8 (*) 0.4 - 3.1 (%)    RBC. 2.16 (*) 3.87 - 5.11 (MIL/uL)    Retic Count, Manual 125.3  19.0 - 186.0 (K/uL)   TSH     Status: Normal   Collection Time   06/04/11  5:40 AM      Component Value Range Comment   TSH 0.987  0.350 - 4.500 (uIU/mL)   VITAMIN B12     Status: Abnormal  Collection Time   06/04/11  5:40 AM      Component Value Range Comment   Vitamin B-12 135 (*) 211 - 911 (pg/mL)   TYPE AND SCREEN     Status: Normal   Collection Time   06/04/11  7:09 AM      Component Value Range Comment   ABO/RH(D) O POS      Antibody Screen NEG      Sample Expiration 06/07/2011      Unit Number [id number]      Blood Component Type RED CELLS,LR      Unit division 00      Status of Unit ISSUED,FINAL      Transfusion Status OK TO TRANSFUSE      Crossmatch Result Compatible     PREPARE RBC (CROSSMATCH)     Status: Normal   Collection Time   06/04/11  7:14 AM      Component Value Range Comment   Order Confirmation ORDER PROCESSED BY BLOOD BANK     CBC     Status: Abnormal   Collection Time   06/05/11  5:51 AM      Component Value Range Comment   WBC 12.1 (*) 4.0 - 10.5 (K/uL)    RBC 3.05 (*) 3.87 - 5.11 (MIL/uL)    Hemoglobin 9.4 (*) 12.0 - 15.0 (g/dL) DELTA CHECK NOTED   HCT 29.0 (*) 36.0 - 46.0 (%)    MCV 95.1  78.0 - 100.0 (fL)    MCH 30.8  26.0 - 34.0 (pg)    MCHC 32.4  30.0 - 36.0 (g/dL)    RDW 78.2 (*) 95.6 - 15.5 (%)    Platelets 478 (*) 150 -  400 (K/uL)   COMPREHENSIVE METABOLIC PANEL     Status: Abnormal   Collection Time   06/05/11  5:51 AM      Component Value Range Comment   Sodium 142  135 - 145 (mEq/L)    Potassium 3.4 (*) 3.5 - 5.1 (mEq/L)    Chloride 107  96 - 112 (mEq/L)    CO2 27  19 - 32 (mEq/L)    Glucose, Bld 99  70 - 99 (mg/dL)    BUN 12  6 - 23 (mg/dL)    Creatinine, Ser 2.13  0.50 - 1.10 (mg/dL)    Calcium 8.5  8.4 - 10.5 (mg/dL)    Total Protein 6.7  6.0 - 8.3 (g/dL)    Albumin 2.4 (*) 3.5 - 5.2 (g/dL)    AST 18  0 - 37 (U/L)    ALT 11  0 - 35 (U/L)    Alkaline Phosphatase 218 (*) 39 - 117 (U/L)    Total Bilirubin 0.7  0.3 - 1.2 (mg/dL)    GFR calc non Af Amer >60  >60 (mL/min)    GFR calc Af Amer >60  >60 (mL/min)    Recent Results (from the past 240 hour(s))  CULTURE, BLOOD (ROUTINE X 2)     Status: Normal (Preliminary result)   Collection Time   06/03/11  7:24 PM      Component Value Range Status Comment   Specimen Description BLOOD LEFT ANTECUBITAL   Final    Special Requests BOTTLES DRAWN AEROBIC ONLY 5CC   Final    Culture NO GROWTH 1 DAY   Final    Report Status PENDING   Incomplete   CULTURE, BLOOD (ROUTINE X 2)     Status: Normal (Preliminary result)   Collection Time   06/03/11  8:30 PM      Component Value Range Status Comment   Specimen Description BLOOD LEFT FOREARM DRAWN BY RN   Final    Special Requests     Final    Value: BOTTLES DRAWN AEROBIC AND ANAEROBIC AEB=6CC ANA=8CC   Culture NO GROWTH 1 DAY   Final    Report Status PENDING   Incomplete   MRSA PCR SCREENING     Status: Abnormal   Collection Time   06/04/11  1:24 AM      Component Value Range Status Comment   MRSA by PCR POSITIVE (*) NEGATIVE  Final     US Venous Img Lower Bilateral  06/04/2011  *RADIOLOGY REPORT*  Clinical Data: Bilateral lower extremity swelling.  Recent left hip surgery.  VENOUS DUPLEX ULTRASOUND OF BILATERAL LOWER EXTREMITIES  Technique:  Gray-scale sonography with graded compression, as well as color  Doppler and duplex ultrasound, were performed to evaluate the deep venous system of both lower extremities from the level of the common femoral vein through the popliteal and proximal calf veins.  Spectral Doppler was utilized to evaluate flow at rest and with distal augmentation maneuvers.  Comparison:  None.  Findings: There is nonocclusive deep venous thrombosis of the left common femoral vein and of the left superficial femoral vein. The other veins of the left leg are normal.  The deep venous system of the right lower extremity is normal.  IMPRESSION:  1.  Nonocclusive deep venous thrombosis of the left common femoral vein and left superficial femoral vein. 2.  Otherwise normal exam.  Original Report Authenticated By: Gwynn Burly, M.D.      Medications: I have reviewed the patient's current medications.  Impression: 1. Cellulitis of left foot with ulcers to the left foot. 2. Nonocclusive DVT of the left common femoral and left superficial femoral veins.. 3. Hypertension. 4. Anemia secondary to blood loss. Hemoglobin improved status post blood transfusion. This     Plan: 1. She is currently on therapeutic dose of Lovenox. I will ask pharmacy to start her on warfarin. 2. Continue intravenous antibiotics. 3. Discharge to [facility] soon, I have been told that they can manage monitoring the warfarin.      LOS: 2 days   Shaolin Armas C 06/05/2011, 8:35 AM

## 2011-06-05 NOTE — Telephone Encounter (Signed)
If she is in the hospital cancel the appointment

## 2011-06-05 NOTE — Progress Notes (Signed)
Physical Therapy Treatment Patient Name: Wendy Coleman Date: 06/05/2011 TIME: 1015-1045 CHARGES: 1 Te 1 GT Problem List:  Patient Active Problem List  Diagnoses  . Hip fracture, intertrochanteric  . Dementia  . Pressure sore on heel  . Cellulitis of foot  . Leg swelling  . HTN (hypertension)   Past Medical History:  Past Medical History  Diagnosis Date  . Hypertension    Past Surgical History:  Past Surgical History  Procedure Date  . Orif hip fracture 05/23/2011    Procedure: OPEN REDUCTION INTERNAL FIXATION HIP;  Surgeon: Fuller Canada, MD;  Location: [facility];  Service: Orthopedics;  Laterality: Left;  with Gamma Nail  . Hip arthrotomy   . Abdominal hysterectomy    Precautions/Restrictions  Precautions Precautions: Fall;Other (comment) (Contact) Restrictions Weight Bearing Restrictions: Yes LLE Weight Bearing: Partial weight bearing Mobility (including Balance) Bed Mobility Supine to Sit: 4: Min assist Supine to Sit Details (indicate cue type and reason): vc's for hand placement assist with trunk Sitting - Scoot to Edge of Bed: 6: Modified independent (Device/Increase time) Sit to Supine - Right: 6: Modified independent (Device/Increase time) Transfers Transfers: Yes Sit to Stand: 5: Supervision Sit to Stand Details (indicate cue type and reason): RW;vcs for hand placement Ambulation/Gait Ambulation/Gait: Yes Ambulation/Gait Assistance: 4: Min assist Ambulation/Gait Assistance Details (indicate cue type and reason): RW:vc's for RW technique Ambulation Distance (Feet): 18 Feet Assistive device: Rolling walker Gait Pattern: Decreased stance time - left Gait velocity: normal Stairs: No Wheelchair Mobility Wheelchair Mobility: No    Exercise  Total Joint Exercises Ankle Circles/Pumps: Both;20 reps Quad Sets: Both;10 reps Gluteal Sets: Both;10 reps Short Arc Quad: Both;10 reps Heel Slides: Both;10 reps Hip ABduction/ADduction: Both;10 reps General  Exercises - Lower Extremity Ankle Circles/Pumps: Both;20 reps Quad Sets: Both;10 reps Gluteal Sets: Both;10 reps Short Arc Quad: Both;10 reps Heel Slides: Both;10 reps Hip ABduction/ADduction: Both;10 reps Low Level/ICU Exercises Ankle Circles/Pumps: Both;20 reps Quad Sets: Both;10 reps Short Arc Quad: Both;10 reps Hip ABduction/ADduction: Both;10 reps Heel Slides: Both;10 reps  End of Session PT - End of Session Equipment Utilized During Treatment: Gait belt (RW) Activity Tolerance: Treatment limited secondary to agitation Patient left: in bed;with call bell in reach;with family/visitor present General Behavior During Session: [facility] for tasks performed Cognition: [facility] for tasks performed PT Assessment/Plan  PT - Assessment/Plan Comments on Treatment Session: Pt needs encouragement to complete exercises;pt disagreed to sit/stand/walk;but complied, although impulsive with standing and gait PT Goals  Acute Rehab PT Goals PT Goal: Supine/Side to Sit - Progress: Met PT Transfer Goal: Sit to Stand/Stand to Sit - Progress: Met PT Goal: Ambulate - Progress: Progressing toward goal  Tayah Idrovo ATKINSO 06/05/2011, 10:56 AM

## 2011-06-05 NOTE — Telephone Encounter (Signed)
Done

## 2011-06-05 NOTE — Progress Notes (Signed)
  Date, apparently the patient had an ultrasound of her leg which showed a proximal femoral DVT  I have advised her medical doctor that it's okay to treat her despite the subcutaneous discoloration from bleeding that is shown in the limb.

## 2011-06-05 NOTE — Consult Note (Signed)
ANTICOAGULATION CONSULT NOTE - Initial Consult  Pharmacy Consult for Warfarin and Lovenox Indication: VTE treatment  No Known Allergies  Patient Measurements: Height: 5\' 3"  (160 cm) Weight: 115 lb 1.3 oz (52.2 kg) IBW/kg (Calculated) : 52.4   Vital Signs: Temp: 97.9 F (36.6 C) (09/06 0529) BP: 176/79 mmHg (09/06 0529) Pulse Rate: 79  (09/06 0529)  Labs:  Basename 06/05/11 0940 06/05/11 0551 06/04/11 0540 06/03/11 1922  HGB -- 9.4* 6.7* --  HCT -- 29.0* 20.7* 23.7*  PLT -- 478* 406* 461*  APTT -- -- -- --  LABPROT 13.7 -- -- 13.4  INR 1.03 -- -- 1.00  HEPARINUNFRC -- -- -- --  CREATININE -- 0.60 0.72 0.87  CRCLEARANCE -- -- -- --  CKTOTAL -- -- -- --  CKMB -- -- -- --  TROPONINI -- -- -- --   Medical History: Past Medical History  Diagnosis Date  . Hypertension    Medications:  Scheduled:    . ampicillin-sulbactam (UNASYN) IV  1.5 g Intravenous Q6H  . bimatoprost  1 drop Both Eyes QHS  . citalopram  10 mg Oral QAM  . collagenase   Topical Daily  . enoxaparin  1 mg/kg Subcutaneous Q12H  . feeding supplement  237 mL Oral TID BM  . furosemide  20 mg Intravenous Once  . lisinopril  5 mg Oral QAM  . multivitamins ther. w/minerals  1 tablet Oral Daily  . OLANZapine  2.5 mg Oral QHS  . polyethylene glycol  17 g Oral Daily  . polyvinyl alcohol  1 drop Both Eyes BID  . potassium chloride  40 mEq Oral Once  . protein supplement  30 mL Oral BID BM  . sodium chloride      . sodium chloride      . warfarin  4 mg Oral ONCE-1800  . DISCONTD: furosemide  20 mg Intravenous Once  . DISCONTD: warfarin  7.5 mg Oral ONCE-1800    Assessment: INR not at therapeutic level On full dose Lovenox until INR therapeutic Goal of Therapy:  INR 2-3   Plan: Lovenox 1mg /kg sq q12hrs until INR > 2 x 2 days Coumadin 4mg  today Monitor CBC per protocol  Valrie Hart A 06/05/2011,11:00 AM

## 2011-06-06 LAB — BASIC METABOLIC PANEL
BUN: 12 mg/dL (ref 6–23)
Calcium: 8.6 mg/dL (ref 8.4–10.5)
Creatinine, Ser: 0.67 mg/dL (ref 0.50–1.10)
GFR calc Af Amer: >60 mL/min (ref >60)

## 2011-06-06 LAB — CBC
MCHC: 32 g/dL (ref 30.0–36.0)
Platelets: 494 10*3/uL — ABNORMAL HIGH (ref 150–400)
RDW: 17.4 % — ABNORMAL HIGH (ref 11.5–15.5)
WBC: 11.6 10*3/uL — ABNORMAL HIGH (ref 4.0–10.5)

## 2011-06-06 LAB — PROTIME-INR: INR: 1.11 (ref 0.00–1.49)

## 2011-06-06 MED ORDER — SODIUM CHLORIDE 0.9 % IJ SOLN
INTRAMUSCULAR | Status: AC
  Administered 2011-06-06: 15:00:00
  Filled 2011-06-06: qty 3

## 2011-06-06 MED ORDER — IPRATROPIUM BROMIDE 0.02 % IN SOLN
RESPIRATORY_TRACT | Status: AC
  Filled 2011-06-06: qty 2.5

## 2011-06-06 MED ORDER — SODIUM CHLORIDE 0.9 % IJ SOLN
INTRAMUSCULAR | Status: AC
  Administered 2011-06-06: 09:00:00
  Filled 2011-06-06: qty 10

## 2011-06-06 MED ORDER — SODIUM CHLORIDE 0.9 % IJ SOLN
INTRAMUSCULAR | Status: AC
  Administered 2011-06-06: 10 mL
  Filled 2011-06-06: qty 10

## 2011-06-06 MED ORDER — CHLORHEXIDINE GLUCONATE CLOTH 2 % EX PADS: 6.0000 | MEDICATED_PAD | Freq: Every day | CUTANEOUS | Status: DC

## 2011-06-06 MED ORDER — CHLORHEXIDINE GLUCONATE CLOTH 2 % EX PADS
6.0000 | MEDICATED_PAD | Freq: Every day | CUTANEOUS | Status: DC
  Administered 2011-06-06 – 2011-06-09 (×4): 6 via TOPICAL

## 2011-06-06 MED ORDER — MUPIROCIN 2 % EX OINT
TOPICAL_OINTMENT | Freq: Two times a day (BID) | CUTANEOUS | Status: DC
  Administered 2011-06-06 – 2011-06-08 (×6): via NASAL
  Filled 2011-06-06: qty 22

## 2011-06-06 MED ORDER — WARFARIN SODIUM 2 MG PO TABS
4.0000 mg | ORAL_TABLET | Freq: Once | ORAL | Status: AC
  Administered 2011-06-06: 4 mg via ORAL
  Filled 2011-06-06: qty 2

## 2011-06-06 NOTE — Progress Notes (Signed)
Subjective: This 77 year old lady was admitted with a cellulitis of the left foot and swelling and bruising in the left leg. She does have a DVT in the left leg.          Physical Exam: Blood pressure 166/73, pulse 74, temperature 97 F (36.1 C), temperature source Oral, resp. rate 16, height 5\' 3"  (1.6 m), weight 52.2 kg (115 lb 1.3 oz), SpO2 96.00%. She looks systemically well. She does not appear to be any pain. Her sounds are present and normal. Lung fields are clear. Abdomen is soft and nontender. She does have cellulitis in the left foot with 2 ulcers seen, anterior foot and left posterior heel. She appears to be alert and orientated.    Investigations: Results for orders placed during the hospital encounter of 06/03/11 (from the past 48 hour(s))  CBC     Status: Abnormal   Collection Time   06/05/11  5:51 AM      Component Value Range Comment   WBC 12.1 (*) 4.0 - 10.5 (K/uL)    RBC 3.05 (*) 3.87 - 5.11 (MIL/uL)    Hemoglobin 9.4 (*) 12.0 - 15.0 (g/dL) DELTA CHECK NOTED   HCT 29.0 (*) 36.0 - 46.0 (%)    MCV 95.1  78.0 - 100.0 (fL)    MCH 30.8  26.0 - 34.0 (pg)    MCHC 32.4  30.0 - 36.0 (g/dL)    RDW 16.1 (*) 09.6 - 15.5 (%)    Platelets 478 (*) 150 - 400 (K/uL)   COMPREHENSIVE METABOLIC PANEL     Status: Abnormal   Collection Time   06/05/11  5:51 AM      Component Value Range Comment   Sodium 142  135 - 145 (mEq/L)    Potassium 3.4 (*) 3.5 - 5.1 (mEq/L)    Chloride 107  96 - 112 (mEq/L)    CO2 27  19 - 32 (mEq/L)    Glucose, Bld 99  70 - 99 (mg/dL)    BUN 12  6 - 23 (mg/dL)    Creatinine, Ser 0.45  0.50 - 1.10 (mg/dL)    Calcium 8.5  8.4 - 10.5 (mg/dL)    Total Protein 6.7  6.0 - 8.3 (g/dL)    Albumin 2.4 (*) 3.5 - 5.2 (g/dL)    AST 18  0 - 37 (U/L)    ALT 11  0 - 35 (U/L)    Alkaline Phosphatase 218 (*) 39 - 117 (U/L)    Total Bilirubin 0.7  0.3 - 1.2 (mg/dL)    GFR calc non Af Amer >60  >60 (mL/min)    GFR calc Af Amer >60  >60 (mL/min)   PROTIME-INR      Status: Normal   Collection Time   06/05/11  9:40 AM      Component Value Range Comment   Prothrombin Time 13.7  11.6 - 15.2 (seconds)    INR 1.03  0.00 - 1.49    BASIC METABOLIC PANEL     Status: Normal   Collection Time   06/06/11  5:26 AM      Component Value Range Comment   Sodium 140  135 - 145 (mEq/L)    Potassium 3.8  3.5 - 5.1 (mEq/L)    Chloride 103  96 - 112 (mEq/L)    CO2 26  19 - 32 (mEq/L)    Glucose, Bld 98  70 - 99 (mg/dL)    BUN 12  6 - 23 (mg/dL)    Creatinine,  Ser 0.67  0.50 - 1.10 (mg/dL)    Calcium 8.6  8.4 - 10.5 (mg/dL)    GFR calc non Af Amer >60  >60 (mL/min)    GFR calc Af Amer >60  >60 (mL/min)   CBC     Status: Abnormal   Collection Time   06/06/11  5:26 AM      Component Value Range Comment   WBC 11.6 (*) 4.0 - 10.5 (K/uL)    RBC 3.25 (*) 3.87 - 5.11 (MIL/uL)    Hemoglobin 9.8 (*) 12.0 - 15.0 (g/dL)    HCT 40.9 (*) 81.1 - 46.0 (%)    MCV 94.2  78.0 - 100.0 (fL)    MCH 30.2  26.0 - 34.0 (pg)    MCHC 32.0  30.0 - 36.0 (g/dL)    RDW 91.4 (*) 78.2 - 15.5 (%)    Platelets 494 (*) 150 - 400 (K/uL)   PROTIME-INR     Status: Normal   Collection Time   06/06/11  5:26 AM      Component Value Range Comment   Prothrombin Time 14.5  11.6 - 15.2 (seconds)    INR 1.11  0.00 - 1.49     Recent Results (from the past 240 hour(s))  CULTURE, BLOOD (ROUTINE X 2)     Status: Normal (Preliminary result)   Collection Time   06/03/11  7:24 PM      Component Value Range Status Comment   Specimen Description BLOOD LEFT ANTECUBITAL   Final    Special Requests BOTTLES DRAWN AEROBIC ONLY 5CC   Final    Culture NO GROWTH 2 DAYS   Final    Report Status PENDING   Incomplete   CULTURE, BLOOD (ROUTINE X 2)     Status: Normal (Preliminary result)   Collection Time   06/03/11  8:30 PM      Component Value Range Status Comment   Specimen Description BLOOD LEFT FOREARM DRAWN BY RN   Final    Special Requests     Final    Value: BOTTLES DRAWN AEROBIC AND ANAEROBIC AEB=6CC ANA=8CC    Culture NO GROWTH 2 DAYS   Final    Report Status PENDING   Incomplete   MRSA PCR SCREENING     Status: Abnormal   Collection Time   06/04/11  1:24 AM      Component Value Range Status Comment   MRSA by PCR POSITIVE (*) NEGATIVE  Final     Ct Femur Left Wo Contrast  06/05/2011  *RADIOLOGY REPORT*  Clinical Data: Status post fracture fixation with swelling of the inner left thigh.  Question hematoma.  RADIOLOGY EXAMINATION  Comparison: Plain films left hip 05/21/2011.  Fluoroscopic spot views 05/23/2011.  Findings: No hematoma is seen in the medial aspect of the left thigh.  There is some infiltration of subcutaneous fat diffusely which could be due to dependent change possibly cellulitis.  There is a small subcutaneous fluid collection over the left greater trochanter most consistent with a seroma related to recent surgery. Dynamic hip screw and short IM nail for fixation of a left intertrochanteric fracture are noted.  Hardware is intact.  No acute fracture is identified.  IMPRESSION:  1.  Negative for hematoma. 2.  Infiltration of subcutaneous fat is compatible with dependent change or possibly cellulitis. 3.  Status post fixation of a left intertrochanteric fracture.  Original Report Authenticated By: Bernadene Bell. Maricela Curet, M.D.   US Venous Img Lower Bilateral  06/04/2011  *RADIOLOGY  REPORT*  Clinical Data: Bilateral lower extremity swelling.  Recent left hip surgery.  VENOUS DUPLEX ULTRASOUND OF BILATERAL LOWER EXTREMITIES  Technique:  Gray-scale sonography with graded compression, as well as color Doppler and duplex ultrasound, were performed to evaluate the deep venous system of both lower extremities from the level of the common femoral vein through the popliteal and proximal calf veins.  Spectral Doppler was utilized to evaluate flow at rest and with distal augmentation maneuvers.  Comparison:  None.  Findings: There is nonocclusive deep venous thrombosis of the left common femoral vein and of the left  superficial femoral vein. The other veins of the left leg are normal.  The deep venous system of the right lower extremity is normal.  IMPRESSION:  1.  Nonocclusive deep venous thrombosis of the left common femoral vein and left superficial femoral vein. 2.  Otherwise normal exam.  Original Report Authenticated By: Gwynn Burly, M.D.      Medications: I have reviewed the patient's current medications.  Impression: 1. Cellulitis of left foot with ulcers to the left foot. 2. Nonocclusive DVT of the left common femoral and left superficial femoral veins.. 3. Hypertension.      Plan: 1. She is currently on therapeutic dose of Lovenox. She will continue with this and warfarin has been started yesterday by pharmacy. 2. Continue intravenous antibiotics. 3. Discharge to [facility] soon, likely after the weekend.      LOS: 3 days   GOSRANI,NIMISH C 06/06/2011, 8:13 AM

## 2011-06-06 NOTE — Consult Note (Signed)
ANTICOAGULATION CONSULT NOTE -  Consult  Pharmacy Consult for Warfarin and Lovenox Indication: VTE treatment  No Known Allergies  Patient Measurements: Height: 5\' 3"  (160 cm) Weight: 115 lb 1.3 oz (52.2 kg) IBW/kg (Calculated) : 52.4   Vital Signs: Temp: 97 F (36.1 C) (09/07 0643) BP: 166/73 mmHg (09/07 0643) Pulse Rate: 74  (09/07 0643)  Labs:  Basename 06/06/11 0526 06/05/11 0940 06/05/11 0551 06/04/11 0540 06/03/11 1922  HGB 9.8* -- 9.4* -- --  HCT 30.6* -- 29.0* 20.7* --  PLT 494* -- 478* 406* --  APTT -- -- -- -- --  LABPROT 14.5 13.7 -- -- 13.4  INR 1.11 1.03 -- -- 1.00  HEPARINUNFRC -- -- -- -- --  CREATININE 0.67 -- 0.60 0.72 --  CRCLEARANCE -- -- -- -- --  CKTOTAL -- -- -- -- --  CKMB -- -- -- -- --  TROPONINI -- -- -- -- --   Medical History: Past Medical History  Diagnosis Date  . Hypertension    Medications:  Scheduled:     . ampicillin-sulbactam (UNASYN) IV  1.5 g Intravenous Q6H  . bimatoprost  1 drop Both Eyes QHS  . Chlorhexidine Gluconate Cloth  6 each Topical Q0600  . citalopram  10 mg Oral QAM  . collagenase   Topical Daily  . enoxaparin  1 mg/kg Subcutaneous Q12H  . feeding supplement  237 mL Oral TID BM  . feeding supplement  30 mL Oral BID AC  . lisinopril  5 mg Oral QAM  . multivitamins ther. w/minerals  1 tablet Oral Daily  . mupirocin   Nasal BID  . OLANZapine  2.5 mg Oral QHS  . polyethylene glycol  17 g Oral Daily  . polyvinyl alcohol  1 drop Both Eyes BID  . sodium chloride      . warfarin  4 mg Oral ONCE-1800  . DISCONTD: Chlorhexidine Gluconate Cloth  6 each Topical Q0600  . DISCONTD: protein supplement  30 mL Oral BID BM  . DISCONTD: warfarin  7.5 mg Oral ONCE-1800    Assessment: INR not at therapeutic level On full dose Lovenox until INR therapeutic Goal of Therapy:  INR 2-3   Plan: Lovenox 1mg /kg sq q12hrs until INR > 2 x 2 days Coumadin 4mg  today Monitor CBC per protocol  Gilman Buttner, Suhayla Chisom J 06/06/2011,10:32  AM

## 2011-06-06 NOTE — Progress Notes (Signed)
Physical Therapy Treatment Patient Name: KHARIS LAPENNA SWFUX'N Date: 06/06/2011 TIME: [id number] CHARGES: 1 Te 1 GT Problem List:  Patient Active Problem List  Diagnoses  . Hip fracture, intertrochanteric  . Dementia  . Pressure sore on heel  . Cellulitis of foot  . Leg swelling  . HTN (hypertension)   Past Medical History:  Past Medical History  Diagnosis Date  . Hypertension    Past Surgical History:  Past Surgical History  Procedure Date  . Orif hip fracture 05/23/2011    Procedure: OPEN REDUCTION INTERNAL FIXATION HIP;  Surgeon: Fuller Canada, MD;  Location: [facility];  Service: Orthopedics;  Laterality: Left;  with Gamma Nail  . Hip arthrotomy   . Abdominal hysterectomy    Precautions/Restrictions  Precautions Precautions: Fall;Other (comment) (Contact) Restrictions Weight Bearing Restrictions: Yes LLE Weight Bearing: Partial weight bearing Mobility (including Balance) Bed Mobility Supine to Sit: 4: Min assist Sitting - Scoot to Edge of Bed: 6: Modified independent (Device/Increase time) Transfers Transfers: Yes Sit to Stand: 5: Supervision Ambulation/Gait Ambulation/Gait: Yes Ambulation/Gait Assistance Details (indicate cue type and reason): RW;vc's needed to [employer] RW around Materials engineer (Feet): 20 Feet Assistive device: Rolling walker Gait Pattern: Within Functional Limits Stairs: No Wheelchair Mobility Wheelchair Mobility: No    Exercise  Total Joint Exercises Ankle Circles/Pumps: Both;20 reps Short Arc Quad: Both;10 reps Heel Slides: Both;10 reps Hip ABduction/ADduction: Both;10 reps Straight Leg Raises: Both;20 reps Long Arc Quad: Both;20 reps General Exercises - Lower Extremity Ankle Circles/Pumps: Both;20 reps Short Arc Quad: Both;10 reps Long Arc Quad: Both;20 reps Heel Slides: Both;10 reps Hip ABduction/ADduction: Both;10 reps Straight Leg Raises: Both;20 reps Low Level/ICU Exercises Ankle Circles/Pumps: Both;20 reps Short  Arc Quad: Both;10 reps Hip ABduction/ADduction: Both;10 reps Heel Slides: Both;10 reps  End of Session PT - End of Session Equipment Utilized During Treatment: Gait belt (RW) Activity Tolerance: Patient tolerated treatment well Patient left: in chair;with family/visitor present General Behavior During Session: [facility] for tasks performed Cognition: [facility] for tasks performed PT Assessment/Plan  PT - Assessment/Plan Comments on Treatment Session: Pt was very cooperative today and tolerated all therpy well PT Goals  Acute Rehab PT Goals PT Goal: Supine/Side to Sit - Progress: Met PT Transfer Goal: Sit to Stand/Stand to Sit - Progress: Met PT Goal: Ambulate - Progress: Met  Lira Stephen ATKINSO 06/06/2011, 10:14 AM

## 2011-06-07 LAB — PROTIME-INR: Prothrombin Time: 16.1 seconds — ABNORMAL HIGH (ref 11.6–15.2)

## 2011-06-07 MED ORDER — IPRATROPIUM BROMIDE 0.02 % IN SOLN
RESPIRATORY_TRACT | Status: AC
  Filled 2011-06-07: qty 2.5

## 2011-06-07 MED ORDER — WARFARIN SODIUM 6 MG PO TABS
6.0000 mg | ORAL_TABLET | Freq: Once | ORAL | Status: DC
  Filled 2011-06-07: qty 1

## 2011-06-07 NOTE — Consult Note (Signed)
ANTICOAGULATION CONSULT NOTE -  Consult  Pharmacy Consult for Warfarin and Lovenox Indication: VTE treatment  No Known Allergies  Patient Measurements: Height: 5\' 3"  (160 cm) Weight: 115 lb 1.3 oz (52.2 kg) IBW/kg (Calculated) : 52.4   Vital Signs: Temp: 97.4 F (36.3 C) (09/08 0636) Temp src: Axillary (09/08 0636) BP: 168/67 mmHg (09/08 0636) Pulse Rate: 67  (09/08 0636)  Labs:  Alvira Philips 06/07/11 0554 06/06/11 0526 06/05/11 0940 06/05/11 0551  HGB -- 9.8* -- 9.4*  HCT -- 30.6* -- 29.0*  PLT -- 494* -- 478*  APTT -- -- -- --  LABPROT 16.1* 14.5 13.7 --  INR 1.26 1.11 1.03 --  HEPARINUNFRC -- -- -- --  CREATININE -- 0.67 -- 0.60  CRCLEARANCE -- -- -- --  CKTOTAL -- -- -- --  CKMB -- -- -- --  TROPONINI -- -- -- --   Medical History: Past Medical History  Diagnosis Date  . Hypertension    Medications:  Scheduled:     . ampicillin-sulbactam (UNASYN) IV  1.5 g Intravenous Q6H  . bimatoprost  1 drop Both Eyes QHS  . Chlorhexidine Gluconate Cloth  6 each Topical Q0600  . citalopram  10 mg Oral QAM  . collagenase   Topical Daily  . enoxaparin  1 mg/kg Subcutaneous Q12H  . feeding supplement  237 mL Oral TID BM  . feeding supplement  30 mL Oral BID AC  . ipratropium      . ipratropium      . lisinopril  5 mg Oral QAM  . multivitamins ther. w/minerals  1 tablet Oral Daily  . mupirocin   Nasal BID  . OLANZapine  2.5 mg Oral QHS  . polyethylene glycol  17 g Oral Daily  . polyvinyl alcohol  1 drop Both Eyes BID  . sodium chloride      . sodium chloride      . sodium chloride      . warfarin  4 mg Oral ONCE-1800    Assessment: INR not at therapeutic level On full dose Lovenox until INR therapeutic Goal of Therapy:  INR 2-3   Plan: Lovenox 1mg /kg sq q12hrs until INR > 2 x 2 days Coumadin 6 mg today Monitor CBC per protocol  Raquel James, Zacharius Funari Bennett 06/07/2011,8:18 AM

## 2011-06-07 NOTE — Progress Notes (Signed)
Subjective: This 77 year old lady was admitted with a cellulitis of the left foot and swelling and bruising in the left leg. She does have a DVT in the left leg.          Physical Exam: Blood pressure 168/67, pulse 67, temperature 97.4 F (36.3 C), temperature source Axillary, resp. rate 17, height 5\' 3"  (1.6 m), weight 52.2 kg (115 lb 1.3 oz), SpO2 94.00%. She looks systemically well. She does not appear to be any pain. Her sounds are present and normal. Lung fields are clear. Abdomen is soft and nontender. She does have cellulitis in the left foot with 2 ulcers seen, anterior foot and left posterior heel. She appears to be alert and orientated.    Investigations: Results for orders placed during the hospital encounter of 06/03/11 (from the past 48 hour(s))  BASIC METABOLIC PANEL     Status: Normal   Collection Time   06/06/11  5:26 AM      Component Value Range Comment   Sodium 140  135 - 145 (mEq/L)    Potassium 3.8  3.5 - 5.1 (mEq/L)    Chloride 103  96 - 112 (mEq/L)    CO2 26  19 - 32 (mEq/L)    Glucose, Bld 98  70 - 99 (mg/dL)    BUN 12  6 - 23 (mg/dL)    Creatinine, Ser 1.61  0.50 - 1.10 (mg/dL)    Calcium 8.6  8.4 - 10.5 (mg/dL)    GFR calc non Af Amer >60  >60 (mL/min)    GFR calc Af Amer >60  >60 (mL/min)   CBC     Status: Abnormal   Collection Time   06/06/11  5:26 AM      Component Value Range Comment   WBC 11.6 (*) 4.0 - 10.5 (K/uL)    RBC 3.25 (*) 3.87 - 5.11 (MIL/uL)    Hemoglobin 9.8 (*) 12.0 - 15.0 (g/dL)    HCT 09.6 (*) 04.5 - 46.0 (%)    MCV 94.2  78.0 - 100.0 (fL)    MCH 30.2  26.0 - 34.0 (pg)    MCHC 32.0  30.0 - 36.0 (g/dL)    RDW 40.9 (*) 81.1 - 15.5 (%)    Platelets 494 (*) 150 - 400 (K/uL)   PROTIME-INR     Status: Normal   Collection Time   06/06/11  5:26 AM      Component Value Range Comment   Prothrombin Time 14.5  11.6 - 15.2 (seconds)    INR 1.11  0.00 - 1.49    PROTIME-INR     Status: Abnormal   Collection Time   06/07/11  5:54 AM   Component Value Range Comment   Prothrombin Time 16.1 (*) 11.6 - 15.2 (seconds)    INR 1.26  0.00 - 1.49     Recent Results (from the past 240 hour(s))  CULTURE, BLOOD (ROUTINE X 2)     Status: Normal (Preliminary result)   Collection Time   06/03/11  7:24 PM      Component Value Range Status Comment   Specimen Description BLOOD LEFT ANTECUBITAL   Final    Special Requests BOTTLES DRAWN AEROBIC ONLY 5CC   Final    Culture NO GROWTH 4 DAYS   Final    Report Status PENDING   Incomplete   CULTURE, BLOOD (ROUTINE X 2)     Status: Normal (Preliminary result)   Collection Time   06/03/11  8:30 PM  Component Value Range Status Comment   Specimen Description BLOOD LEFT FOREARM DRAWN BY RN   Final    Special Requests     Final    Value: BOTTLES DRAWN AEROBIC AND ANAEROBIC AEB=6CC ANA=8CC   Culture NO GROWTH 4 DAYS   Final    Report Status PENDING   Incomplete   MRSA PCR SCREENING     Status: Abnormal   Collection Time   06/04/11  1:24 AM      Component Value Range Status Comment   MRSA by PCR POSITIVE (*) NEGATIVE  Final     No results found.    Medications: I have reviewed the patient's current medications.  Impression: 1. Cellulitis of left foot with ulcers to the left foot. 2. Nonocclusive DVT of the left common femoral and left superficial femoral veins.. 3. Hypertension.      Plan: 1. She is currently on therapeutic dose of Lovenox. She has started warfarin per pharmacy. 2. Continue intravenous antibiotics. 3. Discharge to [facility] soon, likely after the weekend.      LOS: 4 days   GOSRANI,NIMISH C 06/07/2011, 10:39 AM

## 2011-06-08 LAB — BASIC METABOLIC PANEL
BUN: 14 mg/dL (ref 6–23)
Chloride: 106 mEq/L (ref 96–112)
Creatinine, Ser: 0.67 mg/dL (ref 0.50–1.10)
GFR calc Af Amer: >60 mL/min (ref >60)
GFR calc non Af Amer: >60 mL/min (ref >60)
Glucose, Bld: 90 mg/dL (ref 70–99)

## 2011-06-08 LAB — CULTURE, BLOOD (ROUTINE X 2): Culture: NO GROWTH

## 2011-06-08 MED ORDER — AMLODIPINE BESYLATE 5 MG PO TABS
5.0000 mg | ORAL_TABLET | Freq: Every day | ORAL | Status: DC
  Administered 2011-06-08 – 2011-06-09 (×2): 5 mg via ORAL
  Filled 2011-06-08 (×2): qty 1

## 2011-06-08 MED ORDER — WARFARIN SODIUM 6 MG PO TABS
6.0000 mg | ORAL_TABLET | Freq: Once | ORAL | Status: AC
  Administered 2011-06-08: 6 mg via ORAL
  Filled 2011-06-08: qty 1

## 2011-06-08 MED ORDER — CEPHALEXIN 250 MG/5ML PO SUSR
500.0000 mg | Freq: Three times a day (TID) | ORAL | Status: DC
  Administered 2011-06-08 – 2011-06-09 (×4): 500 mg via ORAL
  Filled 2011-06-08 (×10): qty 10

## 2011-06-08 NOTE — Progress Notes (Signed)
Subjective: This 77 year old lady was admitted with a cellulitis of the left foot and swelling and bruising in the left leg. She does have a DVT in the left leg. Yesterday, she was somewhat more lethargic, with poor by mouth intake and virtually no mobilization.          Physical Exam: Blood pressure 175/73, pulse 80, temperature 98.4 F (36.9 C), temperature source Oral, resp. rate 18, height 5\' 3"  (1.6 m), weight 52.2 kg (115 lb 1.3 oz), SpO2 98.00%. She looks systemically well. She does not appear to be any pain. Her sounds are present and normal. Lung fields are clear. Abdomen is soft and nontender. She does have cellulitis in the left foot with 2 ulcers seen, anterior foot and left posterior heel. The cellulitis is clearly improved.    Investigations: Results for orders placed during the hospital encounter of 06/03/11 (from the past 48 hour(s))  PROTIME-INR     Status: Abnormal   Collection Time   06/07/11  5:54 AM      Component Value Range Comment   Prothrombin Time 16.1 (*) 11.6 - 15.2 (seconds)    INR 1.26  0.00 - 1.49    BASIC METABOLIC PANEL     Status: Normal   Collection Time   06/08/11  4:44 AM      Component Value Range Comment   Sodium 138  135 - 145 (mEq/L)    Potassium 3.6  3.5 - 5.1 (mEq/L)    Chloride 106  96 - 112 (mEq/L)    CO2 25  19 - 32 (mEq/L)    Glucose, Bld 90  70 - 99 (mg/dL)    BUN 14  6 - 23 (mg/dL)    Creatinine, Ser 4.54  0.50 - 1.10 (mg/dL)    Calcium 8.5  8.4 - 10.5 (mg/dL)    GFR calc non Af Amer >60  >60 (mL/min)    GFR calc Af Amer >60  >60 (mL/min)   PROTIME-INR     Status: Abnormal   Collection Time   06/08/11  4:44 AM      Component Value Range Comment   Prothrombin Time 18.2 (*) 11.6 - 15.2 (seconds)    INR 1.48  0.00 - 1.49     Recent Results (from the past 240 hour(s))  CULTURE, BLOOD (ROUTINE X 2)     Status: Normal (Preliminary result)   Collection Time   06/03/11  7:24 PM      Component Value Range Status Comment   Specimen  Description BLOOD LEFT ANTECUBITAL   Final    Special Requests BOTTLES DRAWN AEROBIC ONLY 5CC   Final    Culture NO GROWTH 4 DAYS   Final    Report Status PENDING   Incomplete   CULTURE, BLOOD (ROUTINE X 2)     Status: Normal (Preliminary result)   Collection Time   06/03/11  8:30 PM      Component Value Range Status Comment   Specimen Description BLOOD LEFT FOREARM DRAWN BY RN   Final    Special Requests     Final    Value: BOTTLES DRAWN AEROBIC AND ANAEROBIC AEB=6CC ANA=8CC   Culture NO GROWTH 4 DAYS   Final    Report Status PENDING   Incomplete   MRSA PCR SCREENING     Status: Abnormal   Collection Time   06/04/11  1:24 AM      Component Value Range Status Comment   MRSA by PCR POSITIVE (*) NEGATIVE  Final         Medications: I have reviewed the patient's current medications.  Impression: 1. Cellulitis of left foot with ulcers to the left foot. 2. Nonocclusive DVT of the left common femoral and left superficial femoral veins.. 3. Hypertension, uncontrolled.      Plan: 1. She is currently on therapeutic dose of Lovenox. She has started warfarin per pharmacy. INR 1.48. 2. Discontinue intravenous antibiotics and start oral Keflex. 3. Add amlodipine 5 mg daily to her antihypertensive medication. 4.Discharge to [facility] in the next 1-2 days. 3.a house soon, likely after the weekend.      LOS: 5 days   Wendy Coleman C 06/08/2011, 8:01 AM

## 2011-06-08 NOTE — Consult Note (Signed)
ANTICOAGULATION CONSULT NOTE -  Consult  Pharmacy Consult for Warfarin and Lovenox Indication: VTE treatment  No Known Allergies  Patient Measurements: Height: 5\' 3"  (160 cm) Weight: 115 lb 1.3 oz (52.2 kg) IBW/kg (Calculated) : 52.4   Vital Signs: Temp: 98.4 F (36.9 C) (09/09 0645) Temp src: Oral (09/09 0645) BP: 175/73 mmHg (09/09 0645) Pulse Rate: 80  (09/09 0645)  Labs:  Basename 06/08/11 0444 06/07/11 0554 06/06/11 0526  HGB -- -- 9.8*  HCT -- -- 30.6*  PLT -- -- 494*  APTT -- -- --  LABPROT 18.2* 16.1* 14.5  INR 1.48 1.26 1.11  HEPARINUNFRC -- -- --  CREATININE 0.67 -- 0.67  CRCLEARANCE -- -- --  CKTOTAL -- -- --  CKMB -- -- --  TROPONINI -- -- --   Medical History: Past Medical History  Diagnosis Date  . Hypertension    Medications:  Scheduled:     . amLODipine  5 mg Oral Daily  . bimatoprost  1 drop Both Eyes QHS  . cephALEXin  500 mg Oral TID  . Chlorhexidine Gluconate Cloth  6 each Topical Q0600  . citalopram  10 mg Oral QAM  . collagenase   Topical Daily  . enoxaparin  1 mg/kg Subcutaneous Q12H  . feeding supplement  237 mL Oral TID BM  . feeding supplement  30 mL Oral BID AC  . ipratropium      . ipratropium      . lisinopril  5 mg Oral QAM  . multivitamins ther. w/minerals  1 tablet Oral Daily  . mupirocin   Nasal BID  . OLANZapine  2.5 mg Oral QHS  . polyethylene glycol  17 g Oral Daily  . polyvinyl alcohol  1 drop Both Eyes BID  . warfarin  6 mg Oral ONCE-1800  . DISCONTD: ampicillin-sulbactam (UNASYN) IV  1.5 g Intravenous Q6H    Assessment: INR not at therapeutic level On full dose Lovenox until INR therapeutic Goal of Therapy:  INR 2-3   Plan: Lovenox 1mg /kg sq q12hrs until INR > 2 x 2 days Coumadin 6 mg today Monitor CBC per protocol  Raquel James, Myrakle Wingler Bennett 06/08/2011,8:16 AM

## 2011-06-09 LAB — COMPREHENSIVE METABOLIC PANEL
ALT: 13 U/L (ref 0–35)
Alkaline Phosphatase: 291 U/L — ABNORMAL HIGH (ref 39–117)
BUN: 18 mg/dL (ref 6–23)
CO2: 24 mEq/L (ref 19–32)
GFR calc Af Amer: >60 mL/min (ref >60)
GFR calc non Af Amer: >60 mL/min (ref >60)
Glucose, Bld: 94 mg/dL (ref 70–99)
Potassium: 3.6 mEq/L (ref 3.5–5.1)
Sodium: 136 mEq/L (ref 135–145)
Total Bilirubin: 0.3 mg/dL (ref 0.3–1.2)
Total Protein: 6.6 g/dL (ref 6.0–8.3)

## 2011-06-09 LAB — CBC
HCT: 29.7 % — ABNORMAL LOW (ref 36.0–46.0)
Hemoglobin: 9.4 g/dL — ABNORMAL LOW (ref 12.0–15.0)
MCHC: 31.6 g/dL (ref 30.0–36.0)
RBC: 3.08 MIL/uL — ABNORMAL LOW (ref 3.87–5.11)

## 2011-06-09 LAB — PROTIME-INR
INR: 1.97 — ABNORMAL HIGH (ref 0.00–1.49)
Prothrombin Time: 22.8 seconds — ABNORMAL HIGH (ref 11.6–15.2)

## 2011-06-09 MED ORDER — WARFARIN SODIUM 5 MG PO TABS: 5.0000 mg | ORAL_TABLET | Freq: Once | ORAL | Status: DC

## 2011-06-09 MED ORDER — CEPHALEXIN 250 MG/5ML PO SUSR: 500.0000 mg | Freq: Three times a day (TID) | ORAL | Status: AC

## 2011-06-09 MED ORDER — AMLODIPINE BESYLATE 5 MG PO TABS: 5.0000 mg | ORAL_TABLET | Freq: Every day | ORAL | Status: DC

## 2011-06-09 MED ORDER — ENOXAPARIN SODIUM 100 MG/ML ~~LOC~~ SOLN: 1.0000 mg/kg | Freq: Two times a day (BID) | SUBCUTANEOUS | Status: DC

## 2011-06-09 NOTE — Consult Note (Signed)
Pt d/c today by MD back to [facility]. Pt, facility, and Mr. Turpin aware and agreeable. Pt to transfer via facility van. Cranston Neighbor, RN, reviewed [facility] for accuracy.   Wendy Coleman

## 2011-06-09 NOTE — Progress Notes (Deleted)
Occupational Therapy Evaluation Patient Name: MIALANI REICKS JXBJY'N Date: 06/09/2011 Time in 820 time out 840 Evaluation Problem List:  Patient Active Problem List  Diagnoses  . Hip fracture, intertrochanteric  . Dementia  . Pressure sore on heel  . Cellulitis of foot  . Leg swelling  . HTN (hypertension)   Past Medical History:  Past Medical History  Diagnosis Date  . Hypertension    Past Surgical History:  Past Surgical History  Procedure Date  . Orif hip fracture 05/23/2011    Procedure: OPEN REDUCTION INTERNAL FIXATION HIP;  Surgeon: Fuller Canada, MD;  Location: [facility];  Service: Orthopedics;  Laterality: Left;  with Gamma Nail  . Hip arthrotomy   . Abdominal hysterectomy     Precautions/Restrictions  Precautions Precautions: Fall;Other (comment) (contact) Restrictions Weight Bearing Restrictions: Yes LLE Weight Bearing: Partial weight bearing Prior Functioning  Home Living Type of Home: Assisted living   ADL ADL Eating/Feeding: Performed;Set up Where Assessed - Eating/Feeding: Bed level Grooming: Wash/dry hands;Wash/dry face;Set up Where Assessed - Grooming: Supine, head of bed up ADL Comments: Patient was min pa with UB dressing and min-mod with LB dressing prior to hip fx 2 weeks ago.  She now requires mod-max assist, per caregiver. 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 Arousal/Alertness: Awake/alert Overall Cognitive Status: Appears within functional limits for tasks assessed Orientation Level: Oriented X4 Sensation/Coordination Sensation Light Touch: Appears Intact Extremity Assessment RUE Assessment RUE Assessment: Within Functional Limits LUE Assessment LUE Assessment: Within Functional Limits End of Session OT - End of Session Activity Tolerance: Patient tolerated treatment well Patient left: in bed;with family/visitor present General Behavior During Session: [facility] for tasks  performed Cognition: [facility] for tasks performed OT Assessment/Plan/Recommendation OT Assessment Clinical Impression Statement: A:  Patient presents with decreased I with BADLs and activity tolerance due to recent hip fracture and 2 hospitalizations. OT Recommendation/Assessment: Patient will need skilled OT in the acute care venue OT Problem List: Decreased activity tolerance;Decreased safety awareness OT Goals Acute Rehab OT Goals OT Goal Formulation: With patient ADL Goals Pt Will Perform Grooming: Standing at sink;with min assist Pt Will Perform Upper Body Dressing: with set-up Pt Will Perform Lower Body Dressing: with min assist Miscellaneous OT Goals Miscellaneous OT Goal #1: Patient will increase activity tolerance to fair + from fair -.  Shirlean Mylar, OTR/L  06/09/2011, 9:58 AM

## 2011-06-09 NOTE — Progress Notes (Signed)
Occupational Therapy Evaluation Patient Name: Wendy Coleman Date: 06/09/[id number]  evaluation Problem List:  Patient Active Problem List  Diagnoses  . Hip fracture, intertrochanteric  . Dementia  . Pressure sore on heel  . Cellulitis of foot  . Leg swelling  . HTN (hypertension)   Past Medical History:  Past Medical History  Diagnosis Date  . Hypertension    Past Surgical History:  Past Surgical History  Procedure Date  . Orif hip fracture 05/23/2011    Procedure: OPEN REDUCTION INTERNAL FIXATION HIP;  Surgeon: Fuller Canada, MD;  Location: [facility];  Service: Orthopedics;  Laterality: Left;  with Gamma Nail  . Hip arthrotomy   . Abdominal hysterectomy     Precautions/Restrictions  Precautions Precautions: Fall;Other (comment) (contact) Restrictions Weight Bearing Restrictions: Yes LLE Weight Bearing: Partial weight bearing Prior Functioning  Home Living Type of Home: Assisted living   ADL ADL Eating/Feeding: Performed;Set up Where Assessed - Eating/Feeding: Bed level Grooming: Wash/dry hands;Wash/dry face;Set up Where Assessed - Grooming: Supine, head of bed up ADL Comments: Patient was min pa with UB dressing and min-mod with LB dressing prior to hip fx 2 weeks ago.  She now requires mod-max assist, per caregiver. 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 Arousal/Alertness: Awake/alert Overall Cognitive Status: Appears within functional limits for tasks assessed Orientation Level: Oriented X4 Sensation/Coordination Sensation Light Touch: Appears Intact Extremity Assessment RUE Assessment RUE Assessment: Within Functional Limits LUE Assessment LUE Assessment: Within Functional Limits Mobility    Exercises    End of Session OT - End of Session Activity Tolerance: Patient tolerated treatment well Patient left: in bed;with family/visitor present General Behavior During Session: [facility] for tasks  performed Cognition: [facility] for tasks performed OT Assessment/Plan/Recommendation OT Assessment Clinical Impression Statement: A:  Patient presents with decreased I with BADLs and activity tolerance due to recent hip fracture and 2 hospitalizations. OT Recommendation/Assessment: Patient will need skilled OT in the acute care venue OT Problem List: Decreased activity tolerance;Decreased safety awareness OT Plan OT Frequency: Min 2X/week OT Treatment/Interventions: Therapeutic exercise;Self-care/ADL training;Therapeutic activities OT Recommendation Follow Up Recommendations: LTACH Individuals Consulted Consulted and Agree with Results and Recommendations: Patient;Family member/caregiver OT Goals Acute Rehab OT Goals OT Goal Formulation: With patient ADL Goals Pt Will Perform Grooming: Standing at sink;with min assist Pt Will Perform Upper Body Dressing: with set-up Pt Will Perform Lower Body Dressing: with min assist Miscellaneous OT Goals Miscellaneous OT Goal #1: Patient will increase activity tolerance to fair + from fair -.  Shirlean Mylar, OTR/L  06/09/2011, 10:01 AM

## 2011-06-09 NOTE — Discharge Summary (Signed)
Physician Discharge Summary  Patient ID: Wendy Coleman MRN: [id number] DOB/AGE: 1911/09/26 77 y.o. Primary Care Physician:LUKING,W S, MD, MD Admit date: 06/03/2011 Discharge date: 06/09/2011    Discharge Diagnoses:  1. Non-occlusive DVT of the left common femoral and left superficial femoral veins. 2. Cellulitis of the left foot with ulcers to the left foot. 3. Hypertension.   Current Discharge Medication List    START taking these medications   Details  amLODipine (NORVASC) 5 MG tablet Take 1 tablet (5 mg total) by mouth daily. Qty: 30 tablet, Refills: 0    cephALEXin (KEFLEX) 250 MG/5ML suspension Take 10 mLs (500 mg total) by mouth 3 (three) times daily. Qty: 100 mL, Refills: 3    warfarin (COUMADIN) 5 MG tablet Take 1 tablet (5 mg total) by mouth one time only at 6 PM. Qty: 30 tablet, Refills: 0      CONTINUE these medications which have CHANGED   Details  enoxaparin (LOVENOX) 100 MG/ML SOLN Inject 0.5 mLs (50 mg total) into the skin every 12 (twelve) hours. Qty: 16.8 mL, Refills: 0      CONTINUE these medications which have NOT CHANGED   Details  acetaminophen (TYLENOL) 500 MG tablet Take 1,000 mg by mouth every 4 (four) hours as needed. For pain not exceeding 2 doses with 24 hours     bimatoprost (LUMIGAN) 0.01 % SOLN Apply 1 drop to eye at bedtime.      citalopram (CELEXA) 10 MG tablet Take 10 mg by mouth every morning.      docusate sodium (DULCOLAX) 100 MG capsule Take 100 mg by mouth daily.      lisinopril (PRINIVIL,ZESTRIL) 5 MG tablet Take 5 mg by mouth every morning.      OLANZapine (ZYPREXA) 2.5 MG tablet Take 2.5 mg by mouth at bedtime.      Polyethyl Glycol-Propyl Glycol (SYSTANE ULTRA OP) Apply 1 drop to eye 2 (two) times daily.      polyethylene glycol (MIRALAX / GLYCOLAX) packet Take 17 g by mouth daily. Mix 17g in 8oz water daily      senna-docusate (SENOKOT-S) 8.6-50 MG per tablet Take 1 tablet by mouth at bedtime as needed for constipation.        STOP taking these medications     HYDROcodone-acetaminophen (NORCO) 5-325 MG per tablet         Discharged Condition: Stable and improved.    Consults: Orthopedics, Dr. Romeo Apple.  Significant Diagnostic Studies: Dg Chest 1 View  05/21/2011  *RADIOLOGY REPORT*  Clinical Data: Left shoulder and left hip pain post fall  CHEST - 1 VIEW  Comparison: 08/17/2010  Findings: Enlargement of cardiac silhouette. Atherosclerotic calcification aorta. Pulmonary vascularity mediastinal contours otherwise normal. Chronic bronchitic changes. Calcified granuloma left mid lung. No acute infiltrate or effusion. Diffuse osseous demineralization. Advanced bilateral glenohumeral degenerative changes. Scattered end plate spur formation thoracolumbar spine. No definite acute bony abnormalities.  IMPRESSION: Chronic bronchitic and old granulomatous disease changes. Enlargement of cardiac silhouette. No acute abnormalities.  Original Report Authenticated By: Lollie Marrow, M.D.   Dg Hip Complete Left  05/21/2011  *RADIOLOGY REPORT*  Clinical Data: Left hip pain post fall  LEFT HIP - COMPLETE 2+ VIEW  Comparison: None  Findings: Osseous demineralization. Intertrochanteric fracture left femur with mild varus angulation and displacement. No dislocation of the femoral head. Symmetric hip and SI joints. No pelvic fracture identified. Scattered atherosclerotic calcifications.  IMPRESSION: Displaced mildly angulated intertrochanteric fracture left femur.  Per CMS PQRS reporting requirements (PQRS Measure  24): Given the patient's age of greater than 50 and the fracture site (hip, distal radius, or spine), the patient should be tested for osteoporosis using DXA, and the appropriate treatment considered based on the DXA results.  Original Report Authenticated By: Lollie Marrow, M.D.   Dg Hip Operative Left  05/23/2011  *RADIOLOGY REPORT*  Clinical Data: Left hip fracture  OPERATIVE LEFT HIP  Comparison: 05/21/2011  Findings:  Five digital C-arm fluoroscopic images obtained intraoperatively. 2-minute 58 seconds fluoroscopy utilized by Dr. Romeo Apple. Images are interpreted postoperatively.  Images demonstrate reduction of the dominant fragment of the left intertrochanteric fracture with placement of an IM nail and compression screw at the proximal left femur. Lesser trochanteric fragment remains displaced. Bones diffusely demineralized. Distal locking screw present. No dislocation or pelvic abnormality identified.  IMPRESSION: Post ORIF of intertrochanteric fracture left femur.  Original Report Authenticated By: Lollie Marrow, M.D.   Ct Femur Left Wo Contrast  06/05/2011  *RADIOLOGY REPORT*  Clinical Data: Status post fracture fixation with swelling of the inner left thigh.  Question hematoma.  RADIOLOGY EXAMINATION  Comparison: Plain films left hip 05/21/2011.  Fluoroscopic spot views 05/23/2011.  Findings: No hematoma is seen in the medial aspect of the left thigh.  There is some infiltration of subcutaneous fat diffusely which could be due to dependent change possibly cellulitis.  There is a small subcutaneous fluid collection over the left greater trochanter most consistent with a seroma related to recent surgery. Dynamic hip screw and short IM nail for fixation of a left intertrochanteric fracture are noted.  Hardware is intact.  No acute fracture is identified.  IMPRESSION:  1.  Negative for hematoma. 2.  Infiltration of subcutaneous fat is compatible with dependent change or possibly cellulitis. 3.  Status post fixation of a left intertrochanteric fracture.  Original Report Authenticated By: Bernadene Bell. Maricela Curet, M.D.   US Venous Img Lower Bilateral  06/04/2011  *RADIOLOGY REPORT*  Clinical Data: Bilateral lower extremity swelling.  Recent left hip surgery.  VENOUS DUPLEX ULTRASOUND OF BILATERAL LOWER EXTREMITIES  Technique:  Gray-scale sonography with graded compression, as well as color Doppler and duplex ultrasound, were  performed to evaluate the deep venous system of both lower extremities from the level of the common femoral vein through the popliteal and proximal calf veins.  Spectral Doppler was utilized to evaluate flow at rest and with distal augmentation maneuvers.  Comparison:  None.  Findings: There is nonocclusive deep venous thrombosis of the left common femoral vein and of the left superficial femoral vein. The other veins of the left leg are normal.  The deep venous system of the right lower extremity is normal.  IMPRESSION:  1.  Nonocclusive deep venous thrombosis of the left common femoral vein and left superficial femoral vein. 2.  Otherwise normal exam.  Original Report Authenticated By: Gwynn Burly, M.D.   Dg Shoulder Left  05/21/2011  *RADIOLOGY REPORT*  Clinical Data: Left shoulder pain post fall  LEFT SHOULDER - 2+ VIEW  Comparison: None  Findings: Minimal AC joint degenerative changes. Diffuse osseous demineralization. Advanced glenohumeral degenerative changes left shoulder with bulky inferior spur formation. No definite acute fracture, dislocation, or bone destruction. Left ribs intact.  IMPRESSION: Advanced left glenohumeral degenerative changes of osseous demineralization. No definite acute left shoulder abnormalities.  Original Report Authenticated By: Lollie Marrow, M.D.   Dg C-arm 1-60 Min-no Report  05/23/2011  CLINICAL DATA: Left hip fracture   C-ARM 1-60 MINUTES  Fluoroscopy was utilized by  the requesting physician.  No radiographic  interpretation.      Lab Results: Results for orders placed during the hospital encounter of 06/03/11 (from the past 48 hour(s))  BASIC METABOLIC PANEL     Status: Normal   Collection Time   06/08/11  4:44 AM      Component Value Range Comment   Sodium 138  135 - 145 (mEq/L)    Potassium 3.6  3.5 - 5.1 (mEq/L)    Chloride 106  96 - 112 (mEq/L)    CO2 25  19 - 32 (mEq/L)    Glucose, Bld 90  70 - 99 (mg/dL)    BUN 14  6 - 23 (mg/dL)    Creatinine,  Ser 9.60  0.50 - 1.10 (mg/dL)    Calcium 8.5  8.4 - 10.5 (mg/dL)    GFR calc non Af Amer >60  >60 (mL/min)    GFR calc Af Amer >60  >60 (mL/min)   PROTIME-INR     Status: Abnormal   Collection Time   06/08/11  4:44 AM      Component Value Range Comment   Prothrombin Time 18.2 (*) 11.6 - 15.2 (seconds)    INR 1.48  0.00 - 1.49    PROTIME-INR     Status: Abnormal   Collection Time   06/09/11  5:01 AM      Component Value Range Comment   Prothrombin Time 22.8 (*) 11.6 - 15.2 (seconds)    INR 1.97 (*) 0.00 - 1.49    CBC     Status: Abnormal   Collection Time   06/09/11  5:01 AM      Component Value Range Comment   WBC 7.8  4.0 - 10.5 (K/uL)    RBC 3.08 (*) 3.87 - 5.11 (MIL/uL)    Hemoglobin 9.4 (*) 12.0 - 15.0 (g/dL)    HCT 45.4 (*) 09.8 - 46.0 (%)    MCV 96.4  78.0 - 100.0 (fL)    MCH 30.5  26.0 - 34.0 (pg)    MCHC 31.6  30.0 - 36.0 (g/dL)    RDW 11.9 (*) 14.7 - 15.5 (%)    Platelets 481 (*) 150 - 400 (K/uL)   COMPREHENSIVE METABOLIC PANEL     Status: Abnormal   Collection Time   06/09/11  5:01 AM      Component Value Range Comment   Sodium 136  135 - 145 (mEq/L)    Potassium 3.6  3.5 - 5.1 (mEq/L)    Chloride 105  96 - 112 (mEq/L)    CO2 24  19 - 32 (mEq/L)    Glucose, Bld 94  70 - 99 (mg/dL)    BUN 18  6 - 23 (mg/dL)    Creatinine, Ser 8.29  0.50 - 1.10 (mg/dL)    Calcium 8.6  8.4 - 10.5 (mg/dL)    Total Protein 6.6  6.0 - 8.3 (g/dL)    Albumin 2.4 (*) 3.5 - 5.2 (g/dL)    AST 19  0 - 37 (U/L)    ALT 13  0 - 35 (U/L)    Alkaline Phosphatase 291 (*) 39 - 117 (U/L)    Total Bilirubin 0.3  0.3 - 1.2 (mg/dL)    GFR calc non Af Amer >60  >60 (mL/min)    GFR calc Af Amer >60  >60 (mL/min)    Recent Results (from the past 240 hour(s))  CULTURE, BLOOD (ROUTINE X 2)     Status: Normal   Collection Time  06/03/11  7:24 PM      Component Value Range Status Comment   Specimen Description BLOOD LEFT ANTECUBITAL   Final    Special Requests BOTTLES DRAWN AEROBIC ONLY 5CC   Final     Culture NO GROWTH 5 DAYS   Final    Report Status 06/08/2011 FINAL   Final   CULTURE, BLOOD (ROUTINE X 2)     Status: Normal   Collection Time   06/03/11  8:30 PM      Component Value Range Status Comment   Specimen Description BLOOD LEFT FOREARM DRAWN BY RN   Final    Special Requests     Final    Value: BOTTLES DRAWN AEROBIC AND ANAEROBIC AEB=6CC ANA=8CC   Culture NO GROWTH 5 DAYS   Final    Report Status 06/08/2011 FINAL   Final   MRSA PCR SCREENING     Status: Abnormal   Collection Time   06/04/11  1:24 AM      Component Value Range Status Comment   MRSA by PCR POSITIVE (*) NEGATIVE  Final      Hospital Course: This 77 year old lady was admitted with swelling of the left leg. Please see initial history and physical examination done by Dr. Rito Ehrlich. She underwent venous Doppler of the left leg which confirmed the presence of DVT. She also had cellulitis in the left foot with surrounding ulcerations. She was therefore treated with intravenous antibiotics. She was anticoagulated with Lovenox and started on warfarin. A CT scan of her left leg was done and this did not show any presence of large hematoma. This was a consideration since she recently had left hip surgery for a fracture. Since she has been the hospital she has been mobilizing reasonably well. Her blood pressure has been somewhat uncontrolled and we have added antihypertensive medications. Her warfarin has become therapeutic with an INR of 1.97 today. She will need to continue with Lovenox until her INR is safely in the 2-3 range.  Discharge Exam: Blood pressure 166/75, pulse 82, temperature 97.6 F (36.4 C), temperature source Axillary, resp. rate 16, height 5\' 3"  (1.6 m), weight 52.2 kg (115 lb 1.3 oz), SpO2 98.00%. She looks systemically well. Heart sounds are present and normal. Lung fields are clear. She is alert and orientated. There is no significant swelling in the left upper leg.  Disposition: [facility], assisted-living  facility. She will need anticoagulation for at least 3 months. If she becomes mobile I think this should suffice.However if she does not then a more prolonged course of anticoagulation would be appropriate.  Discharge Orders    Future Orders Please Complete By Expires   Diet - low sodium heart healthy      Increase activity slowly      Discharge instructions      Comments:   INR today is 1.97. Once the INR is between 2 and 3, discontinue Lovenox. INR will need to be monitored very closely until dose of Coumadin stabilizes.        SignedWilson Singer 06/09/2011, 11:02 AM

## 2011-06-09 NOTE — Consult Note (Signed)
ANTICOAGULATION CONSULT NOTE -  Consult  Pharmacy Consult for Warfarin and Lovenox Indication: VTE treatment  No Known Allergies  Patient Measurements: Height: 5\' 3"  (160 cm) Weight: 115 lb 1.3 oz (52.2 kg) IBW/kg (Calculated) : 52.4   Vital Signs: Temp: 97.6 F (36.4 C) (09/10 0657) Temp src: Axillary (09/10 0657) BP: 166/75 mmHg (09/10 0657) Pulse Rate: 82  (09/10 0657)  Labs:  Basename 06/09/11 0501 06/08/11 0444 06/07/11 0554  HGB 9.4* -- --  HCT 29.7* -- --  PLT 481* -- --  APTT -- -- --  LABPROT 22.8* 18.2* 16.1*  INR 1.97* 1.48 1.26  HEPARINUNFRC -- -- --  CREATININE 0.76 0.67 --  CRCLEARANCE -- -- --  CKTOTAL -- -- --  CKMB -- -- --  TROPONINI -- -- --   Medical History: Past Medical History  Diagnosis Date  . Hypertension    Medications:  Scheduled:     . amLODipine  5 mg Oral Daily  . bimatoprost  1 drop Both Eyes QHS  . cephALEXin  500 mg Oral TID  . Chlorhexidine Gluconate Cloth  6 each Topical Q0600  . citalopram  10 mg Oral QAM  . collagenase   Topical Daily  . enoxaparin  1 mg/kg Subcutaneous Q12H  . feeding supplement  237 mL Oral TID BM  . feeding supplement  30 mL Oral BID AC  . lisinopril  5 mg Oral QAM  . multivitamins ther. w/minerals  1 tablet Oral Daily  . mupirocin   Nasal BID  . OLANZapine  2.5 mg Oral QHS  . polyethylene glycol  17 g Oral Daily  . polyvinyl alcohol  1 drop Both Eyes BID  . warfarin  5 mg Oral ONCE-1800  . warfarin  6 mg Oral ONCE-1800  . DISCONTD: warfarin  6 mg Oral ONCE-1800   Assessment: INR not at therapeutic level but rising (and close) On full dose Lovenox until INR therapeutic Goal of Therapy:  INR 2-3   Plan: Lovenox 1mg /kg sq q12hrs until INR > 2 x 2 days Coumadin 5 mg today Monitor CBC per protocol  Wendy Coleman A 06/09/2011,8:05 AM

## 2011-07-07 ENCOUNTER — Emergency Department (HOSPITAL_COMMUNITY): Payer: Medicare Other

## 2011-07-07 ENCOUNTER — Emergency Department (HOSPITAL_COMMUNITY)
Admission: EM | Admit: 2011-07-07 | Discharge: 2011-07-07 | Disposition: A | Discharge status: Other Acute Inpatient Hospital | Payer: Medicare Other | Attending: Emergency Medicine | Admitting: Emergency Medicine

## 2011-07-07 ENCOUNTER — Encounter (HOSPITAL_COMMUNITY): Payer: Self-pay | Admitting: *Deleted

## 2011-07-07 DIAGNOSIS — N949 Unspecified condition associated with female genital organs and menstrual cycle: Secondary | ICD-10-CM | POA: Insufficient documentation

## 2011-07-07 DIAGNOSIS — M25559 Pain in unspecified hip: Secondary | ICD-10-CM | POA: Insufficient documentation

## 2011-07-07 DIAGNOSIS — R1032 Left lower quadrant pain: Secondary | ICD-10-CM | POA: Insufficient documentation

## 2011-07-07 DIAGNOSIS — Z8781 Personal history of (healed) traumatic fracture: Secondary | ICD-10-CM | POA: Insufficient documentation

## 2011-07-07 DIAGNOSIS — Z86718 Personal history of other venous thrombosis and embolism: Secondary | ICD-10-CM | POA: Insufficient documentation

## 2011-07-07 DIAGNOSIS — F0390 Unspecified dementia without behavioral disturbance: Secondary | ICD-10-CM | POA: Insufficient documentation

## 2011-07-07 DIAGNOSIS — Z79899 Other long term (current) drug therapy: Secondary | ICD-10-CM | POA: Insufficient documentation

## 2011-07-07 HISTORY — DX: Cellulitis, unspecified: L03.90

## 2011-07-07 HISTORY — DX: Acute embolism and thrombosis of unspecified deep veins of left lower extremity: I82.402

## 2011-07-07 HISTORY — DX: Unspecified dementia, unspecified severity, without behavioral disturbance, psychotic disturbance, mood disturbance, and anxiety: F03.90

## 2011-07-07 HISTORY — DX: Anemia, unspecified: D64.9

## 2011-07-07 HISTORY — DX: Fracture of unspecified part of neck of left femur, initial encounter for closed fracture: S72.002A

## 2011-07-07 NOTE — ED Notes (Signed)
Pt arrived via [facility] from [place] d/t pelvic/hip pain. Pt c/o pain to left pelvic/hip. Pt also c/o some pain to left arm. ems reports pt fell aprox 2 months ago and fractured left hip. Pt began having increased pain today.

## 2011-07-07 NOTE — ED Notes (Signed)
Pt c/o left pelvic/hip pain.

## 2011-07-07 NOTE — ED Provider Notes (Signed)
History     CSN: [id number] Arrival date & time: 07/07/2011 11:57 AM  Chief Complaint  Patient presents with  . Pelvic Pain  . Hip Pain   level V caveat for dementia. Patient complains of pain in the left groin today. She fractured her left hip approximately 8 weeks ago .  This was repaired by Dr. Romeo Apple with (pins and rods). She has been walking with a walker. Postop complication of a DVT in the left leg. PT has been slightly subtherapeutic. No new trauma. Patient noticed pain to be increased today when she stood.  (Consider location/radiation/quality/duration/timing/severity/associated sxs/prior treatment) HPI  Past Medical History  Diagnosis Date  . Hypertension   . Dementia   . Glaucoma   . Hip fracture, left   . Cellulitis   . Anemia   . Left leg DVT     Past Surgical History  Procedure Date  . Orif hip fracture 05/23/2011    Procedure: OPEN REDUCTION INTERNAL FIXATION HIP;  Surgeon: Fuller Canada, MD;  Location: [facility];  Service: Orthopedics;  Laterality: Left;  with Gamma Nail  . Hip arthrotomy   . Abdominal hysterectomy     History reviewed. No pertinent family history.  History  Substance Use Topics  . Smoking status: Never Smoker   . Smokeless tobacco: Never Used  . Alcohol Use: No    OB History    Grav Para Term Preterm Abortions TAB SAB Ect Mult Living                  Review of Systems  Unable to perform ROS: Dementia    Allergies  Review of patient's allergies indicates no known allergies.  Home Medications   Current Outpatient Rx  Name Route Sig Dispense Refill  . ACETAMINOPHEN 500 MG PO TABS Oral Take 1,000 mg by mouth every 4 (four) hours as needed. For pain not exceeding 2 doses with 24 hours     . AMLODIPINE BESYLATE 5 MG PO TABS Oral Take 1 tablet (5 mg total) by mouth daily. 30 tablet 0  . AMOXICILLIN-POT CLAVULANATE 400-57 MG/5ML PO SUSR Oral Take 800 mg by mouth 2 (two) times daily. Course completed on 07/05/11     . BIMATOPROST  0.01 % OP SOLN Ophthalmic Apply 1 drop to eye at bedtime.      Marland Kitchen CITALOPRAM HYDROBROMIDE 10 MG PO TABS Oral Take 10 mg by mouth every morning.      Marland Kitchen DOCUSATE SODIUM 100 MG PO CAPS Oral Take 100 mg by mouth daily.      Marland Kitchen LISINOPRIL 5 MG PO TABS Oral Take 5 mg by mouth every morning.      Marland Kitchen OLANZAPINE 2.5 MG PO TABS Oral Take 2.5 mg by mouth at bedtime.      Frazier Butt ULTRA OP Ophthalmic Apply 1 drop to eye 2 (two) times daily.      Marland Kitchen POLYETHYL GLYCOL-PROPYL GLYCOL 0.4-0.3 % OP SOLN Both Eyes Place 1 drop into both eyes 2 (two) times daily.      Marland Kitchen POLYETHYLENE GLYCOL 3350 PO PACK Oral Take 17 g by mouth daily. Mix 17g in 8oz water daily      . WARFARIN SODIUM 2.5 MG PO TABS Oral Take 2.5 mg by mouth 2 (two) times a week. Takes on Mondays and Fridays only     . WARFARIN SODIUM 5 MG PO TABS Oral Take 5 mg by mouth daily. Takes daily except for Mondays and Fridays Administered at 5:00 pm     .  DOCUSATE SODIUM 283 MG RE ENEM Rectal Place 1 enema rectally daily as needed. constipation     . ENOXAPARIN SODIUM 100 MG/ML [place] SOLN Subcutaneous Inject 0.5 mLs (50 mg total) into the skin every 12 (twelve) hours. 16.8 mL 0  . HYDROCODONE-ACETAMINOPHEN 5-325 MG PO TABS Oral Take 1 tablet by mouth every 4 (four) hours as needed. pain     . SENNOSIDES-DOCUSATE SODIUM 8.6-50 MG PO TABS Oral Take 1 tablet by mouth at bedtime as needed for constipation.    . WARFARIN SODIUM 5 MG PO TABS Oral Take 5 mg by mouth one time only at 6 PM.        BP 150/57  Pulse 75  Temp(Src) 98.3 F (36.8 C) (Oral)  Resp 16  Ht 5\' 2"  (1.575 m)  Wt 111 lb (50.349 kg)  BMI 20.30 kg/m2  SpO2 100%  Physical Exam  Nursing note and vitals reviewed. Constitutional: She is oriented to person, place, and time. She appears well-developed and well-nourished.  HENT:  Head: Normocephalic and atraumatic.  Eyes: Conjunctivae and EOM are normal. Pupils are equal, round, and reactive to light.  Neck: Normal range of motion. Neck supple.    Cardiovascular: Normal rate and regular rhythm.   Pulmonary/Chest: Effort normal and breath sounds normal.  Abdominal: Soft. Bowel sounds are normal.  Musculoskeletal: Normal range of motion.       Tender left groin. Pain with range of motion of left hip.  Neurological: She is alert and oriented to person, place, and time.  Skin: Skin is warm and dry.  Psychiatric: She has a normal mood and affect.       Moderate dementia    ED Course  Procedures (including critical care time)  Labs Reviewed - No data to display No results found.   No diagnosis found.  Will x-ray left hip. Concerned about hardware instability. May have Pelvic fracture Results for orders placed during the hospital encounter of 06/03/11  CBC      Component Value Range   WBC 14.6 (*) 4.0 - 10.5 (K/uL)   RBC 2.42 (*) 3.87 - 5.11 (MIL/uL)   Hemoglobin 7.3 (*) 12.0 - 15.0 (g/dL)   HCT 16.1 (*) 09.6 - 46.0 (%)   MCV 97.9  78.0 - 100.0 (fL)   MCH 30.2  26.0 - 34.0 (pg)   MCHC 30.8  30.0 - 36.0 (g/dL)   RDW 04.5 (*) 40.9 - 15.5 (%)   Platelets 461 (*) 150 - 400 (K/uL)  BASIC METABOLIC PANEL      Component Value Range   Sodium 137  135 - 145 (mEq/L)   Potassium 3.6  3.5 - 5.1 (mEq/L)   Chloride 104  96 - 112 (mEq/L)   CO2 26  19 - 32 (mEq/L)   Glucose, Bld 126 (*) 70 - 99 (mg/dL)   BUN 22  6 - 23 (mg/dL)   Creatinine, Ser 8.11  0.50 - 1.10 (mg/dL)   Calcium 8.3 (*) 8.4 - 10.5 (mg/dL)   GFR calc non Af Amer >60  >60 (mL/min)   GFR calc Af Amer >60  >60 (mL/min)  PROTIME-INR      Component Value Range   Prothrombin Time 13.4  11.6 - 15.2 (seconds)   INR 1.00  0.00 - 1.49   CULTURE, BLOOD (ROUTINE X 2)      Component Value Range   Specimen Description BLOOD LEFT FOREARM DRAWN BY RN     Special Requests       Value:  BOTTLES DRAWN AEROBIC AND ANAEROBIC AEB=6CC ANA=8CC   Culture NO GROWTH 5 DAYS     Report Status 06/08/2011 FINAL    CULTURE, BLOOD (ROUTINE X 2)      Component Value Range   Specimen  Description BLOOD LEFT ANTECUBITAL     Special Requests BOTTLES DRAWN AEROBIC ONLY 5CC     Culture NO GROWTH 5 DAYS     Report Status 06/08/2011 FINAL    MRSA PCR SCREENING      Component Value Range   MRSA by PCR POSITIVE (*) NEGATIVE   COMPREHENSIVE METABOLIC PANEL      Component Value Range   Sodium 138  135 - 145 (mEq/L)   Potassium 3.5  3.5 - 5.1 (mEq/L)   Chloride 108  96 - 112 (mEq/L)   CO2 26  19 - 32 (mEq/L)   Glucose, Bld 97  70 - 99 (mg/dL)   BUN 18  6 - 23 (mg/dL)   Creatinine, Ser 1.61  0.50 - 1.10 (mg/dL)   Calcium 7.9 (*) 8.4 - 10.5 (mg/dL)   Total Protein 5.5 (*) 6.0 - 8.3 (g/dL)   Albumin 1.9 (*) 3.5 - 5.2 (g/dL)   AST 12  0 - 37 (U/L)   ALT 8  0 - 35 (U/L)   Alkaline Phosphatase 142 (*) 39 - 117 (U/L)   Total Bilirubin 0.4  0.3 - 1.2 (mg/dL)   GFR calc non Af Amer >60  >60 (mL/min)   GFR calc Af Amer >60  >60 (mL/min)  CBC      Component Value Range   WBC 11.1 (*) 4.0 - 10.5 (K/uL)   RBC 2.16 (*) 3.87 - 5.11 (MIL/uL)   Hemoglobin 6.7 (*) 12.0 - 15.0 (g/dL)   HCT 09.6 (*) 04.5 - 46.0 (%)   MCV 95.8  78.0 - 100.0 (fL)   MCH 31.0  26.0 - 34.0 (pg)   MCHC 32.4  30.0 - 36.0 (g/dL)   RDW 40.9 (*) 81.1 - 15.5 (%)   Platelets 406 (*) 150 - 400 (K/uL)  FERRITIN      Component Value Range   Ferritin 121  10 - 291 (ng/mL)  FOLATE      Component Value Range   Folate 14.6    IRON AND TIBC      Component Value Range   Iron 28 (*) 42 - 135 (ug/dL)   TIBC 914 (*) 782 - 956 (ug/dL)   Saturation Ratios 13 (*) 20 - 55 (%)   UIBC 188  125 - 400 (ug/dL)  RETICULOCYTES      Component Value Range   Retic Ct Pct 5.8 (*) 0.4 - 3.1 (%)   RBC. 2.16 (*) 3.87 - 5.11 (MIL/uL)   Retic Count, Manual 125.3  19.0 - 186.0 (K/uL)  TSH      Component Value Range   TSH 0.987  0.350 - 4.500 (uIU/mL)  VITAMIN B12      Component Value Range   Vitamin B-12 135 (*) 211 - 911 (pg/mL)  TYPE AND SCREEN      Component Value Range   ABO/RH(D) O POS     Antibody Screen NEG     Sample  Expiration 06/07/2011     Unit Number [id number]     Blood Component Type RED CELLS,LR     Unit division 00     Status of Unit ISSUED,FINAL     Transfusion Status OK TO TRANSFUSE     Crossmatch Result Compatible    PREPARE  RBC (CROSSMATCH)      Component Value Range   Order Confirmation ORDER PROCESSED BY BLOOD BANK    CBC      Component Value Range   WBC 12.1 (*) 4.0 - 10.5 (K/uL)   RBC 3.05 (*) 3.87 - 5.11 (MIL/uL)   Hemoglobin 9.4 (*) 12.0 - 15.0 (g/dL)   HCT 16.1 (*) 09.6 - 46.0 (%)   MCV 95.1  78.0 - 100.0 (fL)   MCH 30.8  26.0 - 34.0 (pg)   MCHC 32.4  30.0 - 36.0 (g/dL)   RDW 04.5 (*) 40.9 - 15.5 (%)   Platelets 478 (*) 150 - 400 (K/uL)  COMPREHENSIVE METABOLIC PANEL      Component Value Range   Sodium 142  135 - 145 (mEq/L)   Potassium 3.4 (*) 3.5 - 5.1 (mEq/L)   Chloride 107  96 - 112 (mEq/L)   CO2 27  19 - 32 (mEq/L)   Glucose, Bld 99  70 - 99 (mg/dL)   BUN 12  6 - 23 (mg/dL)   Creatinine, Ser 8.11  0.50 - 1.10 (mg/dL)   Calcium 8.5  8.4 - 91.4 (mg/dL)   Total Protein 6.7  6.0 - 8.3 (g/dL)   Albumin 2.4 (*) 3.5 - 5.2 (g/dL)   AST 18  0 - 37 (U/L)   ALT 11  0 - 35 (U/L)   Alkaline Phosphatase 218 (*) 39 - 117 (U/L)   Total Bilirubin 0.7  0.3 - 1.2 (mg/dL)   GFR calc non Af Amer >60  >60 (mL/min)   GFR calc Af Amer >60  >60 (mL/min)  PROTIME-INR      Component Value Range   Prothrombin Time 13.7  11.6 - 15.2 (seconds)   INR 1.03  0.00 - 1.49   BASIC METABOLIC PANEL      Component Value Range   Sodium 140  135 - 145 (mEq/L)   Potassium 3.8  3.5 - 5.1 (mEq/L)   Chloride 103  96 - 112 (mEq/L)   CO2 26  19 - 32 (mEq/L)   Glucose, Bld 98  70 - 99 (mg/dL)   BUN 12  6 - 23 (mg/dL)   Creatinine, Ser 7.82  0.50 - 1.10 (mg/dL)   Calcium 8.6  8.4 - 95.6 (mg/dL)   GFR calc non Af Amer >60  >60 (mL/min)   GFR calc Af Amer >60  >60 (mL/min)  CBC      Component Value Range   WBC 11.6 (*) 4.0 - 10.5 (K/uL)   RBC 3.25 (*) 3.87 - 5.11 (MIL/uL)   Hemoglobin 9.8 (*) 12.0  - 15.0 (g/dL)   HCT 21.3 (*) 08.6 - 46.0 (%)   MCV 94.2  78.0 - 100.0 (fL)   MCH 30.2  26.0 - 34.0 (pg)   MCHC 32.0  30.0 - 36.0 (g/dL)   RDW 57.8 (*) 46.9 - 15.5 (%)   Platelets 494 (*) 150 - 400 (K/uL)  PROTIME-INR      Component Value Range   Prothrombin Time 14.5  11.6 - 15.2 (seconds)   INR 1.11  0.00 - 1.49   PROTIME-INR      Component Value Range   Prothrombin Time 16.1 (*) 11.6 - 15.2 (seconds)   INR 1.26  0.00 - 1.49   BASIC METABOLIC PANEL      Component Value Range   Sodium 138  135 - 145 (mEq/L)   Potassium 3.6  3.5 - 5.1 (mEq/L)   Chloride 106  96 - 112 (  mEq/L)   CO2 25  19 - 32 (mEq/L)   Glucose, Bld 90  70 - 99 (mg/dL)   BUN 14  6 - 23 (mg/dL)   Creatinine, Ser 1.61  0.50 - 1.10 (mg/dL)   Calcium 8.5  8.4 - 09.6 (mg/dL)   GFR calc non Af Amer >60  >60 (mL/min)   GFR calc Af Amer >60  >60 (mL/min)  PROTIME-INR      Component Value Range   Prothrombin Time 18.2 (*) 11.6 - 15.2 (seconds)   INR 1.48  0.00 - 1.49   PROTIME-INR      Component Value Range   Prothrombin Time 22.8 (*) 11.6 - 15.2 (seconds)   INR 1.97 (*) 0.00 - 1.49   CBC      Component Value Range   WBC 7.8  4.0 - 10.5 (K/uL)   RBC 3.08 (*) 3.87 - 5.11 (MIL/uL)   Hemoglobin 9.4 (*) 12.0 - 15.0 (g/dL)   HCT 04.5 (*) 40.9 - 46.0 (%)   MCV 96.4  78.0 - 100.0 (fL)   MCH 30.5  26.0 - 34.0 (pg)   MCHC 31.6  30.0 - 36.0 (g/dL)   RDW 81.1 (*) 91.4 - 15.5 (%)   Platelets 481 (*) 150 - 400 (K/uL)  COMPREHENSIVE METABOLIC PANEL      Component Value Range   Sodium 136  135 - 145 (mEq/L)   Potassium 3.6  3.5 - 5.1 (mEq/L)   Chloride 105  96 - 112 (mEq/L)   CO2 24  19 - 32 (mEq/L)   Glucose, Bld 94  70 - 99 (mg/dL)   BUN 18  6 - 23 (mg/dL)   Creatinine, Ser 7.82  0.50 - 1.10 (mg/dL)   Calcium 8.6  8.4 - 95.6 (mg/dL)   Total Protein 6.6  6.0 - 8.3 (g/dL)   Albumin 2.4 (*) 3.5 - 5.2 (g/dL)   AST 19  0 - 37 (U/L)   ALT 13  0 - 35 (U/L)   Alkaline Phosphatase 291 (*) 39 - 117 (U/L)   Total Bilirubin  0.3  0.3 - 1.2 (mg/dL)   GFR calc non Af Amer >60  >60 (mL/min)   GFR calc Af Amer >60  >60 (mL/min)    MDM  Left hip x-ray shows healing intertrochanteric fracture post ORIF.   no new fractures.  Will DC home. Followup Dr. Lanier Clam, MD 07/07/11 [id number]

## 2011-07-10 ENCOUNTER — Ambulatory Visit (HOSPITAL_COMMUNITY)
Admission: RE | Admit: 2011-07-10 | Discharge: 2011-07-10 | Disposition: A | Discharge status: Home / Self Care | Payer: Medicare Other | Source: Ambulatory Visit | Attending: Family Medicine | Admitting: Family Medicine

## 2011-07-10 ENCOUNTER — Other Ambulatory Visit: Payer: Self-pay | Admitting: Family Medicine

## 2011-07-10 DIAGNOSIS — L97409 Non-pressure chronic ulcer of unspecified heel and midfoot with unspecified severity: Secondary | ICD-10-CM | POA: Insufficient documentation

## 2011-07-10 DIAGNOSIS — R609 Edema, unspecified: Secondary | ICD-10-CM

## 2011-07-10 DIAGNOSIS — IMO0002 Reserved for concepts with insufficient information to code with codable children: Secondary | ICD-10-CM

## 2011-07-10 DIAGNOSIS — M7989 Other specified soft tissue disorders: Secondary | ICD-10-CM | POA: Insufficient documentation

## 2011-07-18 ENCOUNTER — Encounter (HOSPITAL_BASED_OUTPATIENT_CLINIC_OR_DEPARTMENT_OTHER): Payer: Medicare Other | Attending: General Surgery

## 2011-07-18 DIAGNOSIS — Z86718 Personal history of other venous thrombosis and embolism: Secondary | ICD-10-CM | POA: Insufficient documentation

## 2011-07-18 DIAGNOSIS — Z96649 Presence of unspecified artificial hip joint: Secondary | ICD-10-CM | POA: Insufficient documentation

## 2011-07-18 DIAGNOSIS — I10 Essential (primary) hypertension: Secondary | ICD-10-CM | POA: Insufficient documentation

## 2011-07-18 DIAGNOSIS — Z79899 Other long term (current) drug therapy: Secondary | ICD-10-CM | POA: Insufficient documentation

## 2011-07-18 DIAGNOSIS — L89609 Pressure ulcer of unspecified heel, unspecified stage: Secondary | ICD-10-CM | POA: Insufficient documentation

## 2011-07-18 DIAGNOSIS — A4902 Methicillin resistant Staphylococcus aureus infection, unspecified site: Secondary | ICD-10-CM | POA: Insufficient documentation

## 2011-07-18 DIAGNOSIS — Z7901 Long term (current) use of anticoagulants: Secondary | ICD-10-CM | POA: Insufficient documentation

## 2011-08-15 ENCOUNTER — Encounter (HOSPITAL_BASED_OUTPATIENT_CLINIC_OR_DEPARTMENT_OTHER): Payer: Medicare Other | Attending: General Surgery

## 2011-08-15 DIAGNOSIS — Z96649 Presence of unspecified artificial hip joint: Secondary | ICD-10-CM | POA: Insufficient documentation

## 2011-08-15 DIAGNOSIS — Z7901 Long term (current) use of anticoagulants: Secondary | ICD-10-CM | POA: Insufficient documentation

## 2011-08-15 DIAGNOSIS — Z79899 Other long term (current) drug therapy: Secondary | ICD-10-CM | POA: Insufficient documentation

## 2011-08-15 DIAGNOSIS — L89609 Pressure ulcer of unspecified heel, unspecified stage: Secondary | ICD-10-CM | POA: Insufficient documentation

## 2011-08-15 DIAGNOSIS — A4902 Methicillin resistant Staphylococcus aureus infection, unspecified site: Secondary | ICD-10-CM | POA: Insufficient documentation

## 2011-08-15 DIAGNOSIS — I10 Essential (primary) hypertension: Secondary | ICD-10-CM | POA: Insufficient documentation

## 2011-08-15 DIAGNOSIS — Z86718 Personal history of other venous thrombosis and embolism: Secondary | ICD-10-CM | POA: Insufficient documentation

## 2012-04-16 IMAGING — CT CT FEMUR *L* W/O CM
3 of 5 series · 10 of 33 positions shown, 11 images · non-contrast
Comparison: Plain films left hip 05/21/2011.  Fluoroscopic spot
views 05/23/2011.

***ADDENDUM*** CREATED: 06/20/2011 [DATE]

The correct header for this exam should read CT LEFT FEMUR W/O
CONTRAST
***END ADDENDUM*** SIGNED BY: Arimas Popo, M.D.
CLINICAL DATA: Status post fracture fixation with swelling of the
inner left thigh.  Question hematoma.
RADIOLOGY EXAMINATION

[Series 4: mpr knee cor bone 2.0 · coronal · 0.45mm/px · 1 of 91 slices shown]
[im 46/91  bone]
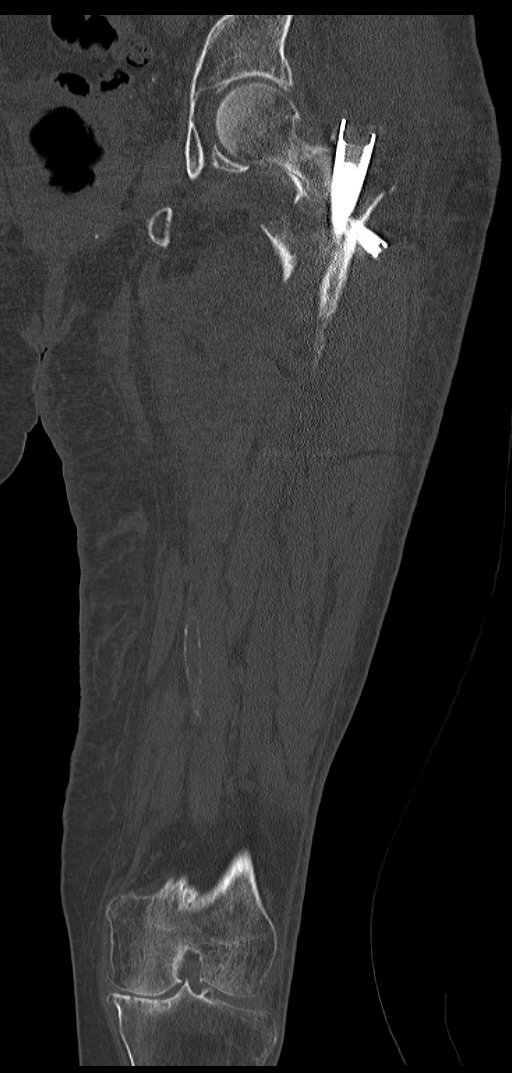

[Series 5: mpr knee sag bone 2.0 · sagittal · 0.42mm/px · 5 of 118 slices shown]
[im 20/118  bone]
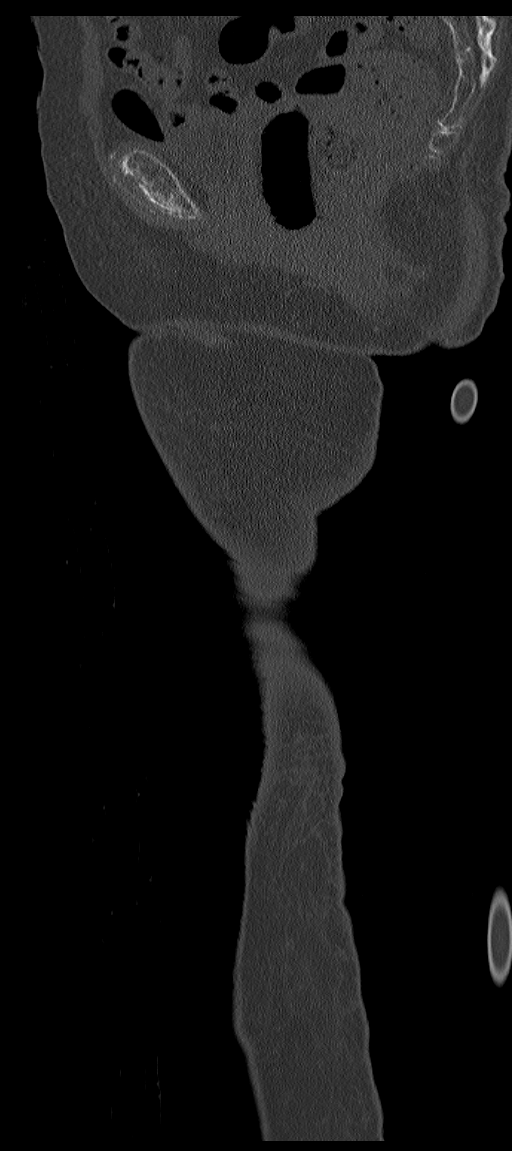
[im 40/118  bone]
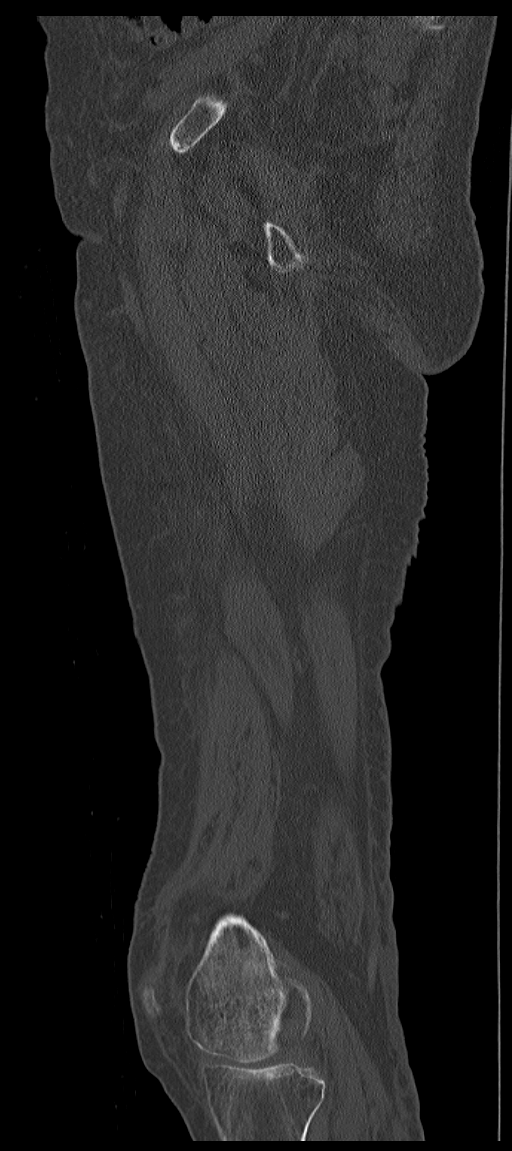
[im 59/118  bone]
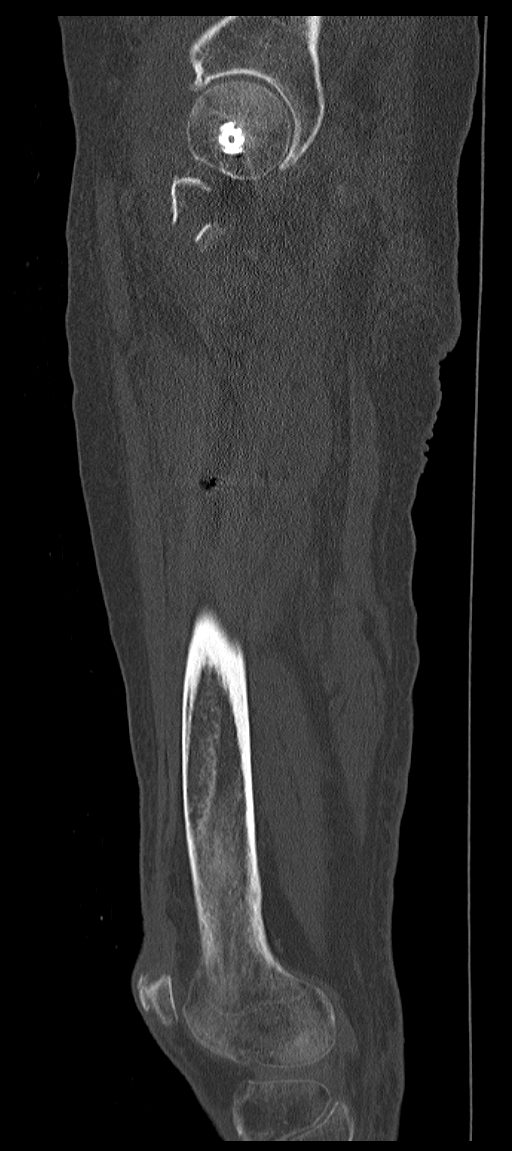
[im 79/118  bone]
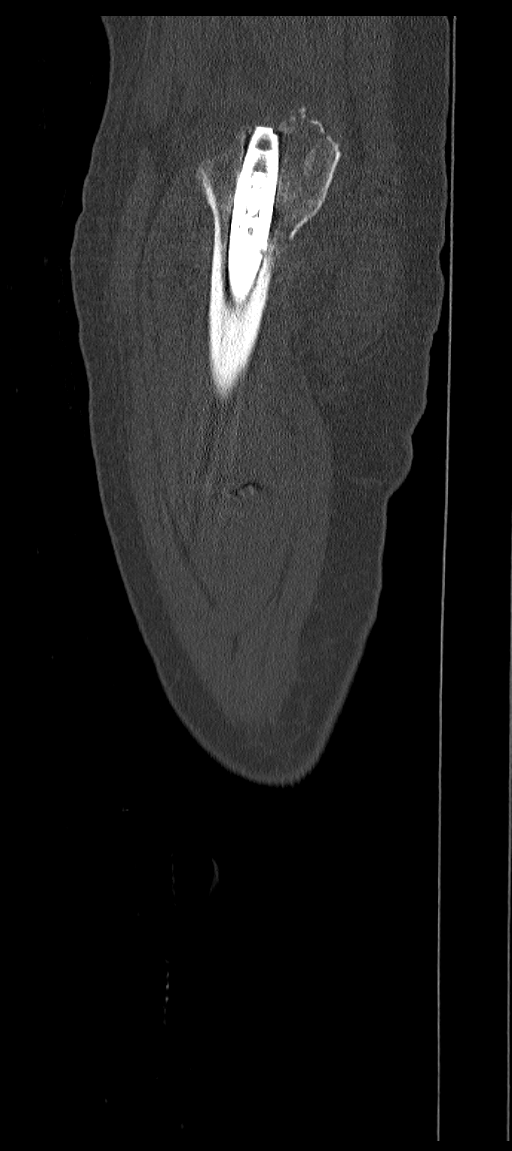
[im 98/118  bone]
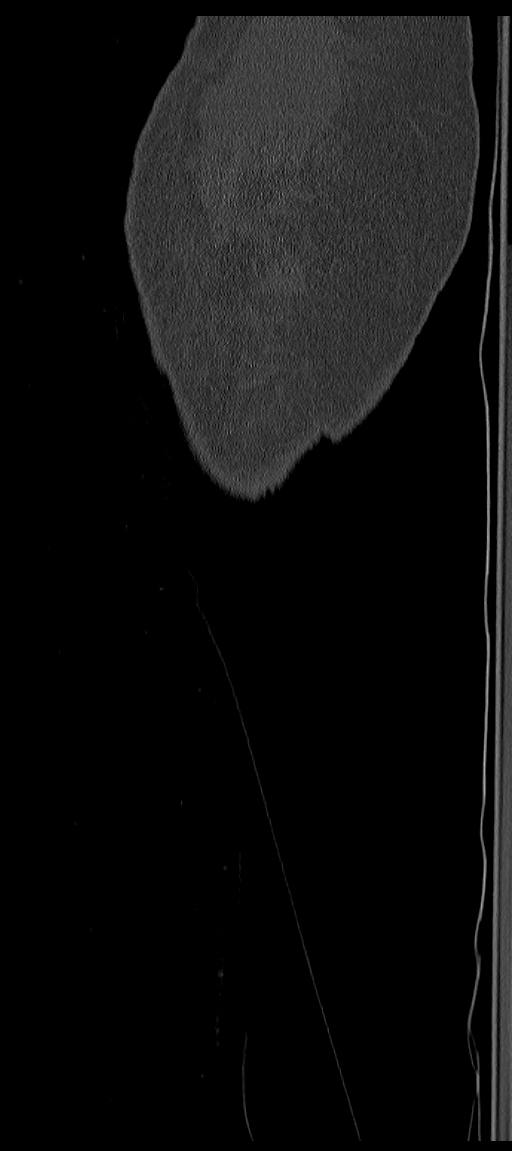

[Series 6: femur axial st 2.0 · axial · 0.52mm/px · z∈[-469,-145]mm · 4 of 236 slices shown, 5 images]
[im 37/236  soft-tissue]
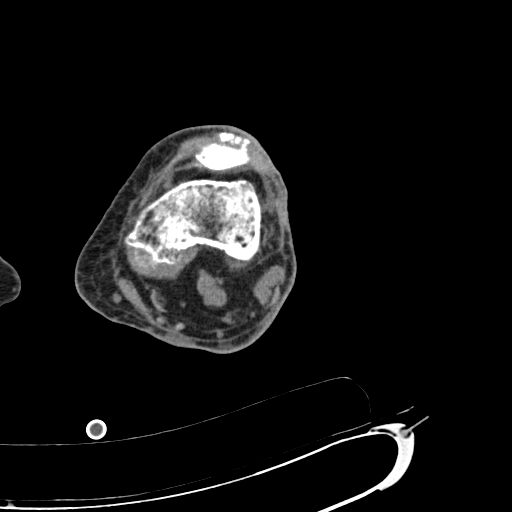
[im 37/236  bone]
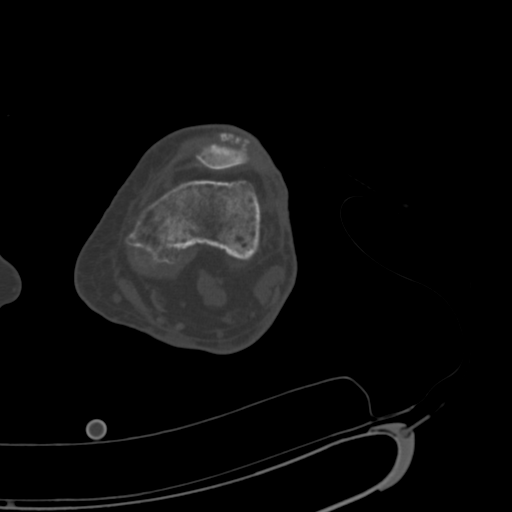
[im 91/236  bone]
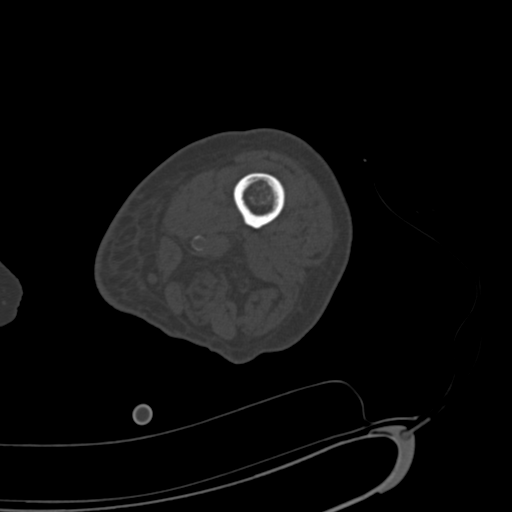
[im 145/236  bone]
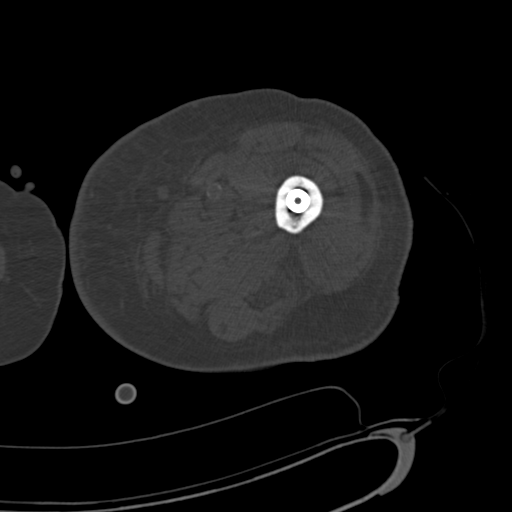
[im 199/236  bone]
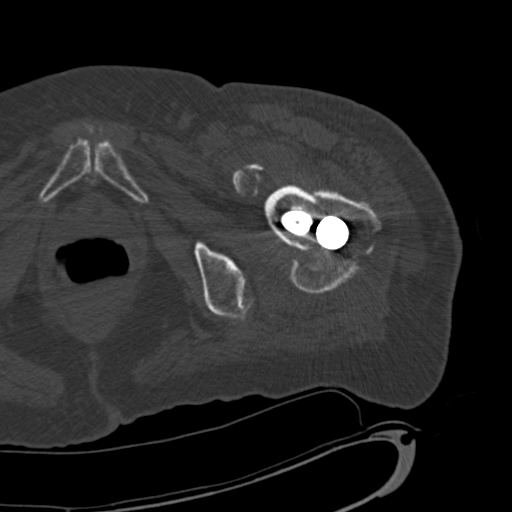

[10 of 33 positions shown; findings below may reference images not displayed]

FINDINGS: No hematoma is seen in the medial aspect of the left
thigh.  There is some infiltration of subcutaneous fat diffusely
which could be due to dependent change possibly cellulitis.  There
is a small subcutaneous fluid collection over the left greater
trochanter most consistent with a seroma related to recent surgery.
Dynamic hip screw and short IM nail for fixation of a left
intertrochanteric fracture are noted.  Hardware is intact.  No
acute fracture is identified.
IMPRESSION: 1.  Negative for hematoma.
2.  Infiltration of subcutaneous fat is compatible with dependent
change or possibly cellulitis.
3.  Status post fixation of a left intertrochanteric fracture.

## 2012-09-15 ENCOUNTER — Emergency Department (HOSPITAL_COMMUNITY)
Admission: EM | Admit: 2012-09-15 | Discharge: 2012-09-16 | Disposition: A | Discharge status: Home / Self Care | Payer: Medicare Other | Attending: Emergency Medicine | Admitting: Emergency Medicine

## 2012-09-15 ENCOUNTER — Encounter (HOSPITAL_COMMUNITY): Payer: Self-pay | Admitting: Emergency Medicine

## 2012-09-15 DIAGNOSIS — F0390 Unspecified dementia without behavioral disturbance: Secondary | ICD-10-CM | POA: Insufficient documentation

## 2012-09-15 DIAGNOSIS — H4089 Other specified glaucoma: Secondary | ICD-10-CM | POA: Insufficient documentation

## 2012-09-15 DIAGNOSIS — Z7982 Long term (current) use of aspirin: Secondary | ICD-10-CM | POA: Insufficient documentation

## 2012-09-15 DIAGNOSIS — D649 Anemia, unspecified: Secondary | ICD-10-CM | POA: Insufficient documentation

## 2012-09-15 DIAGNOSIS — Z79899 Other long term (current) drug therapy: Secondary | ICD-10-CM | POA: Insufficient documentation

## 2012-09-15 DIAGNOSIS — I10 Essential (primary) hypertension: Secondary | ICD-10-CM | POA: Insufficient documentation

## 2012-09-15 DIAGNOSIS — R197 Diarrhea, unspecified: Secondary | ICD-10-CM | POA: Insufficient documentation

## 2012-09-15 DIAGNOSIS — Z86718 Personal history of other venous thrombosis and embolism: Secondary | ICD-10-CM | POA: Insufficient documentation

## 2012-09-15 DIAGNOSIS — Z792 Long term (current) use of antibiotics: Secondary | ICD-10-CM | POA: Insufficient documentation

## 2012-09-15 LAB — CBC WITH DIFFERENTIAL/PLATELET
Basophils Absolute: 0 10*3/uL (ref 0.0–0.1)
Eosinophils Absolute: 0 10*3/uL (ref 0.0–0.7)
Eosinophils Relative: 0 % (ref 0–5)
Lymphs Abs: 0.6 10*3/uL — ABNORMAL LOW (ref 0.7–4.0)
MCH: 30.4 pg (ref 26.0–34.0)
MCHC: 32.6 g/dL (ref 30.0–36.0)
MCV: 93.3 fL (ref 78.0–100.0)
Platelets: 266 10*3/uL (ref 150–400)
RDW: 14.7 % (ref 11.5–15.5)

## 2012-09-15 LAB — URINALYSIS, ROUTINE W REFLEX MICROSCOPIC
Bilirubin Urine: NEGATIVE
Hgb urine dipstick: NEGATIVE
Protein, ur: NEGATIVE mg/dL
Urobilinogen, UA: 0.2 mg/dL (ref 0.0–1.0)

## 2012-09-15 LAB — BASIC METABOLIC PANEL
Calcium: 9 mg/dL (ref 8.4–10.5)
GFR calc non Af Amer: 49 mL/min — ABNORMAL LOW (ref >90)
Glucose, Bld: 142 mg/dL — ABNORMAL HIGH (ref 70–99)
Sodium: 138 mEq/L (ref 135–145)

## 2012-09-15 MED ORDER — SODIUM CHLORIDE 0.9 % IV SOLN
Freq: Once | INTRAVENOUS | Status: AC
  Administered 2012-09-15: 22:00:00 via INTRAVENOUS

## 2012-09-15 MED ORDER — ONDANSETRON HCL 4 MG/2ML IJ SOLN
4.0000 mg | Freq: Once | INTRAMUSCULAR | Status: AC
  Administered 2012-09-15: 4 mg via INTRAVENOUS
  Filled 2012-09-15: qty 2

## 2012-09-15 MED ORDER — SODIUM CHLORIDE 0.9 % IV SOLN
Freq: Once | INTRAVENOUS | Status: AC
  Administered 2012-09-15: 20:00:00 via INTRAVENOUS

## 2012-09-15 MED ORDER — ONDANSETRON 4 MG PO TBDP: 4.0000 mg | ORAL_TABLET | Freq: Three times a day (TID) | ORAL | Status: DC | PRN

## 2012-09-15 NOTE — ED Provider Notes (Signed)
History     CSN: [id number]  Arrival date & time 09/15/12  7829   First MD Initiated Contact with Patient 09/15/12 2001      Chief Complaint  Patient presents with  . Emesis  . Diarrhea    (Consider location/radiation/quality/duration/timing/severity/associated sxs/prior treatment) Patient is a 78 y.o. female presenting with vomiting and diarrhea. The history is provided by the patient and a relative. No language interpreter was used.  Emesis  This is a new (Pt was seen by Dr. Lilyan Punt yesterday, Rx with amoxicillin for sinusitis.  About 5 P.M. today she had onset of vomiting and diarrhea.  She was therefore sent to [facility] ED for evaluation.) problem. The current episode started 3 to 5 hours ago. The problem occurs 2 to 4 times per day. The problem has not changed since onset.The emesis has an appearance of stomach contents. There has been no fever. Associated symptoms include diarrhea. Pertinent negatives include no chills and no fever. Risk factors: Recent Rx with antibiotics.  Diarrhea The primary symptoms include nausea, vomiting and diarrhea. Primary symptoms do not include fever.  The illness does not include chills.    Past Medical History  Diagnosis Date  . Hypertension   . Dementia   . Glaucoma(365)   . Hip fracture, left   . Cellulitis   . Anemia   . Left leg DVT     Past Surgical History  Procedure Date  . Orif hip fracture 05/23/2011    Procedure: OPEN REDUCTION INTERNAL FIXATION HIP;  Surgeon: Fuller Canada, MD;  Location: [facility];  Service: Orthopedics;  Laterality: Left;  with Gamma Nail  . Hip arthrotomy   . Abdominal hysterectomy     No family history on file.  History  Substance Use Topics  . Smoking status: Never Smoker   . Smokeless tobacco: Never Used  . Alcohol Use: No    OB History    Grav Para Term Preterm Abortions TAB SAB Ect Mult Living                  Review of Systems  Constitutional: Negative for fever and chills.   HENT: Positive for sore throat.   Eyes: Negative.   Respiratory: Negative.   Cardiovascular: Negative.   Gastrointestinal: Positive for nausea, vomiting and diarrhea.  Genitourinary: Negative.   Musculoskeletal: Negative.   Skin: Negative.   Neurological: Negative.   Psychiatric/Behavioral: Negative.     Allergies  Review of patient's allergies indicates no known allergies.  Home Medications   Current Outpatient Rx  Name  Route  Sig  Dispense  Refill  . ACETAMINOPHEN 500 MG PO TABS   Oral   Take 1,000 mg by mouth every 4 (four) hours as needed. For pain not exceeding 2 doses with 24 hours          . AMLODIPINE BESYLATE 5 MG PO TABS   Oral   Take 1 tablet (5 mg total) by mouth daily.   30 tablet   0   . AMOXICILLIN 500 MG PO CAPS   Oral   Take 500 mg by mouth 3 (three) times daily.         . ASPIRIN EC 81 MG PO TBEC   Oral   Take 81 mg by mouth daily.         Marland Kitchen BIMATOPROST 0.01 % OP SOLN   Ophthalmic   Apply 1 drop to eye at bedtime.           Marland Kitchen  CITALOPRAM HYDROBROMIDE 10 MG PO TABS   Oral   Take 10 mg by mouth every morning.           Marland Kitchen CRANBERRY 425 MG PO CAPS   Oral   Take 1 capsule by mouth daily.         Marland Kitchen DOCUSATE SODIUM 100 MG PO CAPS   Oral   Take 100 mg by mouth daily.           Marland Kitchen LISINOPRIL 5 MG PO TABS   Oral   Take 5 mg by mouth daily.          . METHYLCELLULOSE 1 % OP SOLN   Both Eyes   Place 1 drop into both eyes 3 (three) times daily.         Marland Kitchen OLANZAPINE 2.5 MG PO TABS   Oral   Take 2.5 mg by mouth at bedtime.           Frazier Butt ULTRA OP   Ophthalmic   Apply 1 drop to eye 2 (two) times daily. Administered at 8am and 2pm           BP 169/98  Pulse 88  Temp 98.1 F (36.7 C) (Oral)  Resp 17  Ht 5\' 1"  (1.549 m)  Wt 117 lb (53.071 kg)  BMI 22.11 kg/m2  SpO2 98%  Physical Exam  ED Course  Procedures (including critical care time)  10:21 PM Results for orders placed during the hospital encounter of  09/15/12  CBC WITH DIFFERENTIAL      Component Value Range   WBC 13.8 (*) 4.0 - 10.5 K/uL   RBC 3.75 (*) 3.87 - 5.11 MIL/uL   Hemoglobin 11.4 (*) 12.0 - 15.0 g/dL   HCT 40.9 (*) 81.1 - 91.4 %   MCV 93.3  78.0 - 100.0 fL   MCH 30.4  26.0 - 34.0 pg   MCHC 32.6  30.0 - 36.0 g/dL   RDW 78.2  95.6 - 21.3 %   Platelets 266  150 - 400 K/uL   Neutrophils Relative 91 (*) 43 - 77 %   Neutro Abs 12.7 (*) 1.7 - 7.7 K/uL   Lymphocytes Relative 5 (*) 12 - 46 %   Lymphs Abs 0.6 (*) 0.7 - 4.0 K/uL   Monocytes Relative 4  3 - 12 %   Monocytes Absolute 0.5  0.1 - 1.0 K/uL   Eosinophils Relative 0  0 - 5 %   Eosinophils Absolute 0.0  0.0 - 0.7 K/uL   Basophils Relative 0  0 - 1 %   Basophils Absolute 0.0  0.0 - 0.1 K/uL  BASIC METABOLIC PANEL      Component Value Range   Sodium 138  135 - 145 mEq/L   Potassium 4.3  3.5 - 5.1 mEq/L   Chloride 104  96 - 112 mEq/L   CO2 25  19 - 32 mEq/L   Glucose, Bld 142 (*) 70 - 99 mg/dL   BUN 25 (*) 6 - 23 mg/dL   Creatinine, Ser 0.86  0.50 - 1.10 mg/dL   Calcium 9.0  8.4 - 57.8 mg/dL   GFR calc non Af Amer 49 (*) >90 mL/min   GFR calc Af Amer 57 (*) >90 mL/min   WBC is elevated at 13,800 with 91% neutrophils.  Chemistries relatively normal.  Glucose is mildly elevated at 142.  Awaiting urine result.  11:38 PM UA is negative.  Case discussed with Lilyan Punt, M.D., covering for Ardyth Gal,  M.D., pt's physician.  He advised holding the amoxicillin.  He recommended that her facility call their office at midmorning tomorrow to decide whether or not to continue the amoxicillin.  Pt should take clear liquids tonight and tomorrow morning.   1. Vomiting and diarrhea      I personally performed the services described in this documentation, which was scribed in my presence. The recorded information has been reviewed and is accurate.  Osvaldo Human, MD        Carleene Cooper III, MD 09/15/12 [phone]

## 2012-09-15 NOTE — ED Notes (Signed)
Patient presents to ER via [facility] from [facility] with c/o nausea, vomiting and diarrhea.  Patient started new antibiotics a few days ago.

## 2012-09-15 NOTE — ED Notes (Signed)
Pt c/o NVD that began this afternoon at 5pm. Pt states she started taking amoxicillin yesterday and she had never taken this abx before. Pt denies any pain. Pt blood in stool or vomit.

## 2013-01-17 ENCOUNTER — Telehealth: Payer: Self-pay | Admitting: Family Medicine

## 2013-01-17 ENCOUNTER — Other Ambulatory Visit: Payer: Self-pay | Admitting: *Deleted

## 2013-01-17 NOTE — Telephone Encounter (Signed)
Med called in with 11 refills. Med tech at [facility]

## 2013-01-17 NOTE — Telephone Encounter (Signed)
Patient needs a prescription of her night time eye drops sent to [facility]

## 2013-01-17 NOTE — Telephone Encounter (Signed)
Ok refill prn

## 2013-02-01 ENCOUNTER — Other Ambulatory Visit: Payer: Self-pay | Admitting: Family Medicine

## 2013-03-07 ENCOUNTER — Other Ambulatory Visit: Payer: Self-pay | Admitting: Family Medicine

## 2013-03-21 ENCOUNTER — Other Ambulatory Visit: Payer: Self-pay | Admitting: Family Medicine

## 2013-03-28 ENCOUNTER — Encounter: Payer: Self-pay | Admitting: *Deleted

## 2013-04-05 ENCOUNTER — Other Ambulatory Visit: Payer: Self-pay | Admitting: Family Medicine

## 2013-04-12 ENCOUNTER — Telehealth: Payer: Self-pay | Admitting: *Deleted

## 2013-04-12 NOTE — Telephone Encounter (Signed)
Discussed with michelle at [facility] and rx for floxin drops [facility] to [facility] and order faxed to [facility].

## 2013-04-12 NOTE — Telephone Encounter (Signed)
While getting her hair done today, pt had a big ball of wax coming out of her ear and behind it was pus , no ear pain. Offered appt today but noone can bring her til Thursday. Can something be called into [facility].

## 2013-04-12 NOTE — Telephone Encounter (Signed)
floxin five drops bid for seven d

## 2013-04-13 ENCOUNTER — Other Ambulatory Visit: Payer: Self-pay | Admitting: Family Medicine

## 2013-04-18 ENCOUNTER — Other Ambulatory Visit: Payer: Self-pay | Admitting: Family Medicine

## 2013-04-20 ENCOUNTER — Other Ambulatory Visit: Payer: Self-pay | Admitting: Family Medicine

## 2013-04-21 ENCOUNTER — Ambulatory Visit (INDEPENDENT_AMBULATORY_CARE_PROVIDER_SITE_OTHER): Payer: Medicare Other | Admitting: Family Medicine

## 2013-04-21 ENCOUNTER — Ambulatory Visit: Payer: Self-pay | Admitting: Family Medicine

## 2013-04-21 ENCOUNTER — Encounter: Payer: Self-pay | Admitting: Family Medicine

## 2013-04-21 VITALS — BP 102/58 | Temp 98.5°F | Wt 121.0 lb

## 2013-04-21 DIAGNOSIS — I10 Essential (primary) hypertension: Secondary | ICD-10-CM

## 2013-04-21 DIAGNOSIS — F0390 Unspecified dementia without behavioral disturbance: Secondary | ICD-10-CM

## 2013-04-25 ENCOUNTER — Other Ambulatory Visit: Payer: Self-pay | Admitting: Family Medicine

## 2013-04-30 ENCOUNTER — Other Ambulatory Visit: Payer: Self-pay | Admitting: Family Medicine

## 2013-05-03 ENCOUNTER — Other Ambulatory Visit: Payer: Self-pay | Admitting: Family Medicine

## 2013-05-09 NOTE — Progress Notes (Signed)
  Subjective:    Patient ID: Wendy Coleman, female    DOB: May 07, 1911, 79 y.o.   MRN: [id number]  HPI  Patient arrives office for followup of her many chronic concerns.  Ongoing challenges with constipation. Generally the stools softener helps but at times not.  Patient notes more wax has developed in her ears. She feels it is compromising her hearing.  Nursing Center reports patient is sleeping well on current regimen of medications. No excessive difficulty with confusion or thought patterns. Ex  Compliant with medication for blood pressure. Trying to watch her diet. Not a lot of salt intake. Compliant with her medicines. Arthritis continues to be a challenge at times. Some pain in the back and hips.  Review of Systems    no change in oral intake, no headache no chest pain no abdominal pain. ROS otherwise negative Objective:   Physical Exam  Alert no acute distress. HEENT normal. Lungs clear. Heart regular in rhythm. Ankles without edema. Knees some crepitations. Some Heberden's nodes noted in the hand.  Significant cerumen in both ears. Removed with minimal difficulty    Assessment & Plan:  Impression 1 cerumen impaction resolved. #2 hypertension good control. #3 insomnia good control. #4 constipation discussed. Plan maintain all medications. May add MiraLAX if necessary. Diet and exercise discussed. Check every 6 months. WSL

## 2013-05-13 ENCOUNTER — Encounter (HOSPITAL_COMMUNITY): Payer: Self-pay

## 2013-05-13 ENCOUNTER — Emergency Department (HOSPITAL_COMMUNITY)
Admission: EM | Admit: 2013-05-13 | Discharge: 2013-05-13 | Disposition: A | Discharge status: Home / Self Care | Payer: Medicare Other | Attending: Emergency Medicine | Admitting: Emergency Medicine

## 2013-05-13 DIAGNOSIS — H748X9 Other specified disorders of middle ear and mastoid, unspecified ear: Secondary | ICD-10-CM | POA: Insufficient documentation

## 2013-05-13 DIAGNOSIS — Z7982 Long term (current) use of aspirin: Secondary | ICD-10-CM | POA: Insufficient documentation

## 2013-05-13 DIAGNOSIS — M129 Arthropathy, unspecified: Secondary | ICD-10-CM | POA: Insufficient documentation

## 2013-05-13 DIAGNOSIS — Z872 Personal history of diseases of the skin and subcutaneous tissue: Secondary | ICD-10-CM | POA: Insufficient documentation

## 2013-05-13 DIAGNOSIS — K5900 Constipation, unspecified: Secondary | ICD-10-CM | POA: Insufficient documentation

## 2013-05-13 DIAGNOSIS — F411 Generalized anxiety disorder: Secondary | ICD-10-CM | POA: Insufficient documentation

## 2013-05-13 DIAGNOSIS — Z862 Personal history of diseases of the blood and blood-forming organs and certain disorders involving the immune mechanism: Secondary | ICD-10-CM | POA: Insufficient documentation

## 2013-05-13 DIAGNOSIS — I10 Essential (primary) hypertension: Secondary | ICD-10-CM | POA: Insufficient documentation

## 2013-05-13 DIAGNOSIS — H748X1 Other specified disorders of right middle ear and mastoid: Secondary | ICD-10-CM

## 2013-05-13 DIAGNOSIS — F0390 Unspecified dementia without behavioral disturbance: Secondary | ICD-10-CM | POA: Insufficient documentation

## 2013-05-13 DIAGNOSIS — G4700 Insomnia, unspecified: Secondary | ICD-10-CM | POA: Insufficient documentation

## 2013-05-13 DIAGNOSIS — H40009 Preglaucoma, unspecified, unspecified eye: Secondary | ICD-10-CM | POA: Insufficient documentation

## 2013-05-13 DIAGNOSIS — Z86718 Personal history of other venous thrombosis and embolism: Secondary | ICD-10-CM | POA: Insufficient documentation

## 2013-05-13 DIAGNOSIS — Z79899 Other long term (current) drug therapy: Secondary | ICD-10-CM | POA: Insufficient documentation

## 2013-05-13 DIAGNOSIS — Z8781 Personal history of (healed) traumatic fracture: Secondary | ICD-10-CM | POA: Insufficient documentation

## 2013-05-13 MED ORDER — AMOXICILLIN 500 MG PO CAPS: 500.0000 mg | ORAL_CAPSULE | Freq: Three times a day (TID) | ORAL | Status: DC

## 2013-05-13 MED ORDER — GLYCERIN (LAXATIVE) 1.2 G RE SUPP
1.0000 | Freq: Once | RECTAL | Status: AC
  Administered 2013-05-13: 1.2 g via RECTAL
  Filled 2013-05-13: qty 1

## 2013-05-13 NOTE — ED Provider Notes (Signed)
Medical screening examination/treatment/procedure(s) were conducted as a shared visit with non-physician practitioner(s) and myself.  I personally evaluated the patient during the encounter  The patient seen by me. Patient for the right knee are bloody discharge we'll need your nose and throat followup suspicious for tympanic membrane rupture. Will treat as if such. For the constipation patient's abdomen is soft nontender will give glycerin suppositories and the nursing facility can address from that point on does not appear to be any signs of an acute abdominal process ongoing.  Shelda Jakes, MD 05/13/13 1236

## 2013-05-13 NOTE — ED Notes (Signed)
Sent from [facility] for evaluation of bleeding from right ear.  The patient states that she went to see Dr. Gerda Diss 2 weeks ago and had her ears cleaned out.

## 2013-05-13 NOTE — ED Provider Notes (Signed)
CSN: [id number]     Arrival date & time 05/13/13  1105 History     First MD Initiated Contact with Patient 05/13/13 1205     Chief Complaint  Patient presents with  . Ear Drainage   (Consider location/radiation/quality/duration/timing/severity/associated sxs/prior Treatment) HPI Comments: Patient who resides at [facility], c/o bloody discharge from the right ear that began at an unknown time.  Patietn states her caregiver noticed a small spot of blood on her pillow on the morning of ED arrival.  Patient denies pain, dizziness, visual changes, neck pain, fever or congestion.  The patient's daughter denies hx of trauma to her ear, or bleeding disorders.  Care giver also states that she has been constipated for several days and states the nursing home was going to give her something for constipation this morning before the bleeding from her ear was noticed.    Patient is a 79 y.o. female presenting with ear pain. The history is provided by the patient, a caregiver and a relative.  Otalgia Location:  Right Behind ear:  No abnormality Quality: no pain. Severity:  No pain Onset quality:  Sudden Timing:  Unable to specify Progression:  Unchanged Chronicity:  New Context: not direct blow, not foreign body in ear and no water in ear   Relieved by:  Nothing Worsened by:  Nothing tried Ineffective treatments:  None tried Associated symptoms: ear discharge   Associated symptoms: no abdominal pain, no congestion, no fever, no headaches, no hearing loss, no neck pain, no rash, no sore throat, no tinnitus and no vomiting   Associated symptoms comment:  No dizziness   Past Medical History  Diagnosis Date  . Hypertension   . Dementia   . Glaucoma   . Hip fracture, left   . Cellulitis   . Anemia   . Left leg DVT   . Anxiety   . Insomnia   . Arthritis    Past Surgical History  Procedure Laterality Date  . Orif hip fracture  05/23/2011    Procedure: OPEN REDUCTION INTERNAL FIXATION  HIP;  Surgeon: Fuller Canada, MD;  Location: [facility];  Service: Orthopedics;  Laterality: Left;  with Gamma Nail  . Hip arthrotomy    . Abdominal hysterectomy     No family history on file. History  Substance Use Topics  . Smoking status: Never Smoker   . Smokeless tobacco: Never Used  . Alcohol Use: No   OB History   Grav Para Term Preterm Abortions TAB SAB Ect Mult Living                 Review of Systems  Constitutional: Negative for fever, chills, activity change and appetite change.  HENT: Positive for ear pain and ear discharge. Negative for hearing loss, congestion, sore throat, facial swelling, trouble swallowing, neck pain and tinnitus.   Eyes: Negative for visual disturbance.  Respiratory: Negative for shortness of breath.   Cardiovascular: Negative.   Gastrointestinal: Positive for constipation. Negative for nausea, vomiting and abdominal pain.  Skin: Negative for rash.  Neurological: Negative for dizziness, syncope, speech difficulty, weakness and headaches.  Hematological: Negative for adenopathy. Does not bruise/bleed easily.  All other systems reviewed and are negative.    Allergies  Review of patient's allergies indicates no known allergies.  Home Medications   Current Outpatient Rx  Name  Route  Sig  Dispense  Refill  . ASPIRIN LOW DOSE 81 MG EC tablet      TAKE 1 TABLET BY MOUTH  ONCE DAILY.   30 tablet   5   . bimatoprost (LUMIGAN) 0.01 % SOLN   Ophthalmic   Apply 1 drop to eye at bedtime.           . citalopram (CELEXA) 10 MG tablet      TAKE (1) TABLET BY MOUTH EACH MORNING.   30 tablet   5   . Cranberry 425 MG CAPS   Oral   Take 1 capsule by mouth daily.         Marland Kitchen lisinopril (PRINIVIL,ZESTRIL) 5 MG tablet      TAKE 1 TABLET BY MOUTH EVERY MORNING.   30 tablet   5   . Multiple Vitamin (DAILY VITE) TABS      TAKE 1 TABLET BY MOUTH ONCE DAILY.   30 tablet   3   . OLANZapine (ZYPREXA) 2.5 MG tablet   Oral   Take 2.5 mg by  mouth at bedtime.         Bertram Gala Glycol-Propyl Glycol 0.4-0.3 % SOLN      NSTILL 1 DROP INTO EACH EYE 4 TIMES DAILY.   10 mL   1   . polyethylene glycol (MIRALAX / GLYCOLAX) packet   Oral   Take 8.5 g by mouth daily.         . STOOL SOFTENER 100 MG capsule      TAKE 1 CAPSULE BY MOUTH ONCE DAILY.   30 capsule   5   . acetaminophen (TYLENOL) 500 MG tablet   Oral   Take 1,000 mg by mouth every 4 (four) hours as needed. For pain not exceeding 2 doses with 24 hours          . Camphor-Eucalyptus-Menthol (VICKS VAPORUB EX)   Nasal   Place 1 application into the nose every 4 (four) hours as needed (nasal congestion).          Marland Kitchen loperamide (IMODIUM) 2 MG capsule   Oral   Take 2 mg by mouth every 6 (six) hours as needed for diarrhea or loose stools.          . ondansetron (ZOFRAN ODT) 4 MG disintegrating tablet   Oral   Take 1 tablet (4 mg total) by mouth every 8 (eight) hours as needed for nausea.   20 tablet   0   . SENNALAX-S 8.6-50 MG per tablet      TAKE 1 TABLET BY MOUTH ONCE DAILY AS NEEDED FOR CONSTIPATION.   30 tablet   1   . white petrolatum (VASELINE) GEL   Topical   Apply 1 application topically 2 (two) times daily as needed. Apply with q-tip bilaterally to nasal septum         . Witch Hazel (PREPARATION H EX)   Apply externally   Apply 1 application topically 4 (four) times daily as needed (hemorrhoidal irritation).           BP 165/78  Pulse 73  Temp(Src) 98.4 F (36.9 C) (Oral)  Resp 20  SpO2 98% Physical Exam  Nursing note and vitals reviewed. Constitutional: She is oriented to person, place, and time. She appears well-developed and well-nourished. No distress.  HENT:  Head: Normocephalic and atraumatic.  Right Ear: No swelling or tenderness. No mastoid tenderness. Decreased hearing is noted.  Left Ear: Tympanic membrane and ear canal normal.  Mouth/Throat: Uvula is midline, oropharynx is clear and moist and mucous membranes  are normal.  Bloody drainage in the right ear canal.  No active  bleeding, edema or FB.  TM not visualized   Neck: Normal range of motion. Neck supple.  Cardiovascular: Normal rate, regular rhythm, normal heart sounds and intact distal pulses.   No murmur heard. Pulmonary/Chest: Effort normal and breath sounds normal. No respiratory distress.  Abdominal: Soft. Bowel sounds are normal. She exhibits no distension and no mass. There is no tenderness. There is no rebound and no guarding.  Musculoskeletal: Normal range of motion.  Lymphadenopathy:    She has no cervical adenopathy.  Neurological: She is alert and oriented to person, place, and time. She exhibits normal muscle tone. Coordination normal.  Skin: Skin is warm and dry.    ED Course   Procedures (including critical care time)  Labs Reviewed - No data to display No results found. No diagnosis found.  MDM     Patient is well appearing.  Denies ear or neck pain.  Some serous drainage and blood to the right ear canal.  No active bleeding, no mastoid abnml, known bleeding disorders, or hx of trauma.  Possible ruptured TM.  Will treat with antibiotic and give ENT f/u.  Family also requesting something for her constipation, will administer glycerin suppository.  abd is soft and NT on exam.  Doubt acute abdominal process.  Family agrees to care plan.  Patient is stable for discharge.   Patient also seen by the EDP and care plan discussed.  Nikeria Kalman L. Trisha Mangle, PA-C 05/14/13 2039

## 2013-05-13 NOTE — ED Notes (Signed)
Pt here from [facility] for evaluation of dried blood in right ear

## 2013-05-26 ENCOUNTER — Other Ambulatory Visit (HOSPITAL_COMMUNITY)
Admission: RE | Admit: 2013-05-26 | Discharge: 2013-05-26 | Disposition: A | Discharge status: Home / Self Care | Payer: Medicare Other | Source: Ambulatory Visit | Attending: Otolaryngology | Admitting: Otolaryngology

## 2013-05-26 ENCOUNTER — Ambulatory Visit (INDEPENDENT_AMBULATORY_CARE_PROVIDER_SITE_OTHER): Payer: Medicare Other | Admitting: Otolaryngology

## 2013-05-26 DIAGNOSIS — H7190 Unspecified cholesteatoma, unspecified ear: Secondary | ICD-10-CM

## 2013-05-26 DIAGNOSIS — H6040 Cholesteatoma of external ear, unspecified ear: Secondary | ICD-10-CM | POA: Insufficient documentation

## 2013-05-31 ENCOUNTER — Other Ambulatory Visit: Payer: Self-pay | Admitting: Family Medicine

## 2013-06-13 ENCOUNTER — Other Ambulatory Visit: Payer: Self-pay | Admitting: Family Medicine

## 2013-06-14 ENCOUNTER — Other Ambulatory Visit: Payer: Self-pay | Admitting: *Deleted

## 2013-06-14 MED ORDER — LOPERAMIDE HCL 2 MG PO CAPS: ORAL_CAPSULE | ORAL | Status: DC

## 2013-06-15 ENCOUNTER — Emergency Department (HOSPITAL_COMMUNITY)
Admission: EM | Admit: 2013-06-15 | Discharge: 2013-06-15 | Disposition: A | Discharge status: Skilled Nursing Facility | Payer: Medicare Other | Attending: Emergency Medicine | Admitting: Emergency Medicine

## 2013-06-15 ENCOUNTER — Encounter (HOSPITAL_COMMUNITY): Payer: Self-pay | Admitting: Emergency Medicine

## 2013-06-15 ENCOUNTER — Emergency Department (HOSPITAL_COMMUNITY): Payer: Medicare Other

## 2013-06-15 DIAGNOSIS — I10 Essential (primary) hypertension: Secondary | ICD-10-CM | POA: Insufficient documentation

## 2013-06-15 DIAGNOSIS — Z862 Personal history of diseases of the blood and blood-forming organs and certain disorders involving the immune mechanism: Secondary | ICD-10-CM | POA: Insufficient documentation

## 2013-06-15 DIAGNOSIS — Z79899 Other long term (current) drug therapy: Secondary | ICD-10-CM | POA: Insufficient documentation

## 2013-06-15 DIAGNOSIS — R0602 Shortness of breath: Secondary | ICD-10-CM | POA: Insufficient documentation

## 2013-06-15 DIAGNOSIS — Z872 Personal history of diseases of the skin and subcutaneous tissue: Secondary | ICD-10-CM | POA: Insufficient documentation

## 2013-06-15 DIAGNOSIS — F0390 Unspecified dementia without behavioral disturbance: Secondary | ICD-10-CM | POA: Insufficient documentation

## 2013-06-15 DIAGNOSIS — G4700 Insomnia, unspecified: Secondary | ICD-10-CM | POA: Insufficient documentation

## 2013-06-15 DIAGNOSIS — R5383 Other fatigue: Secondary | ICD-10-CM

## 2013-06-15 DIAGNOSIS — M129 Arthropathy, unspecified: Secondary | ICD-10-CM | POA: Insufficient documentation

## 2013-06-15 DIAGNOSIS — Z86718 Personal history of other venous thrombosis and embolism: Secondary | ICD-10-CM | POA: Insufficient documentation

## 2013-06-15 DIAGNOSIS — R079 Chest pain, unspecified: Secondary | ICD-10-CM | POA: Insufficient documentation

## 2013-06-15 DIAGNOSIS — F411 Generalized anxiety disorder: Secondary | ICD-10-CM | POA: Insufficient documentation

## 2013-06-15 DIAGNOSIS — R5381 Other malaise: Secondary | ICD-10-CM | POA: Insufficient documentation

## 2013-06-15 DIAGNOSIS — Z8781 Personal history of (healed) traumatic fracture: Secondary | ICD-10-CM | POA: Insufficient documentation

## 2013-06-15 DIAGNOSIS — H409 Unspecified glaucoma: Secondary | ICD-10-CM | POA: Insufficient documentation

## 2013-06-15 DIAGNOSIS — J3489 Other specified disorders of nose and nasal sinuses: Secondary | ICD-10-CM | POA: Insufficient documentation

## 2013-06-15 DIAGNOSIS — Z7982 Long term (current) use of aspirin: Secondary | ICD-10-CM | POA: Insufficient documentation

## 2013-06-15 HISTORY — DX: Otitis media, unspecified, unspecified ear: H66.90

## 2013-06-15 LAB — CBC
Hemoglobin: 11.2 g/dL — ABNORMAL LOW (ref 12.0–15.0)
Platelets: 287 10*3/uL (ref 150–400)
RBC: 3.64 MIL/uL — ABNORMAL LOW (ref 3.87–5.11)

## 2013-06-15 LAB — URINALYSIS, ROUTINE W REFLEX MICROSCOPIC
Glucose, UA: NEGATIVE mg/dL
Leukocytes, UA: NEGATIVE
Protein, ur: NEGATIVE mg/dL
Specific Gravity, Urine: 1.025 (ref 1.005–1.030)
Urobilinogen, UA: 0.2 mg/dL (ref 0.0–1.0)

## 2013-06-15 LAB — BASIC METABOLIC PANEL
CO2: 28 mEq/L (ref 19–32)
GFR calc non Af Amer: 50 mL/min — ABNORMAL LOW (ref >90)
Glucose, Bld: 113 mg/dL — ABNORMAL HIGH (ref 70–99)
Potassium: 4.3 mEq/L (ref 3.5–5.1)
Sodium: 138 mEq/L (ref 135–145)

## 2013-06-15 LAB — POCT I-STAT TROPONIN I: Troponin i, poc: 0 ng/mL (ref 0.00–0.08)

## 2013-06-15 MED ORDER — ASPIRIN 81 MG PO CHEW
324.0000 mg | CHEWABLE_TABLET | Freq: Once | ORAL | Status: AC
  Administered 2013-06-15: 324 mg via ORAL
  Filled 2013-06-15: qty 4

## 2013-06-15 NOTE — ED Notes (Signed)
Patient brought in via EMS from [facility]. Per EMS patient facility staff stated that patient was "spitting up food and not acting right." Per EMS patient was also c/o chest pain and tightness with shortness of breath. EMS also states that patient c/o sleepiness. Patient denies any pain, shortness of breath or weakness at this time. EDP made aware in room to assess patient.

## 2013-06-15 NOTE — ED Provider Notes (Signed)
CSN: [id number]     Arrival date & time 06/15/13  1503 History  This chart was scribed for Audree Camel, MD by Blanchard Kelch, ED Scribe. The patient was seen in room APA05/APA05. Patient's care was started at 3:08 PM.    No chief complaint on file.   The history is provided by the patient and a friend. No language interpreter was used.     HPI Comments: Wendy Coleman is a 79 y.o. female brought in by ambulance, who presents to the Emergency Department because her POA says she was complaining of constant, generalized chest pain with associated shortness of breath that began about an hour ago. She denies any pain currently. She complains of congestion and fatigue (per POA she always complains of fatigue). She denies cough, abdominal pain, fever, headache or current chest pain. She reports eating today. She states that she "just doesn't feel good." She believes is it August, 2000. Her POA denies her having any previous cardiac issues. She currently lives in assisted living.   Past Medical History  Diagnosis Date  . Hypertension   . Dementia   . Glaucoma   . Hip fracture, left   . Cellulitis   . Anemia   . Left leg DVT   . Anxiety   . Insomnia   . Arthritis    Past Surgical History  Procedure Laterality Date  . Orif hip fracture  05/23/2011    Procedure: OPEN REDUCTION INTERNAL FIXATION HIP;  Surgeon: Fuller Canada, MD;  Location: [facility];  Service: Orthopedics;  Laterality: Left;  with Gamma Nail  . Hip arthrotomy    . Abdominal hysterectomy     No family history on file. History  Substance Use Topics  . Smoking status: Never Smoker   . Smokeless tobacco: Never Used  . Alcohol Use: No   OB History   Grav Para Term Preterm Abortions TAB SAB Ect Mult Living                 Review of Systems  Constitutional: Positive for fatigue. Negative for fever.  HENT: Positive for congestion.   Respiratory: Positive for shortness of breath. Negative for cough.   Cardiovascular:  Positive for chest pain.  Gastrointestinal: Negative for abdominal pain.  Neurological: Negative for headaches.  All other systems reviewed and are negative.    Allergies  Review of patient's allergies indicates no known allergies.  Home Medications   Current Outpatient Rx  Name  Route  Sig  Dispense  Refill  . acetaminophen (TYLENOL) 500 MG tablet   Oral   Take 1,000 mg by mouth every 4 (four) hours as needed. For pain not exceeding 2 doses with 24 hours          . amoxicillin (AMOXIL) 500 MG capsule   Oral   Take 1 capsule (500 mg total) by mouth 3 (three) times daily. For 10 days   30 capsule   0   . ASPIRIN LOW DOSE 81 MG EC tablet      TAKE 1 TABLET BY MOUTH ONCE DAILY.   30 tablet   5   . bimatoprost (LUMIGAN) 0.01 % SOLN   Ophthalmic   Apply 1 drop to eye at bedtime.           . Camphor-Eucalyptus-Menthol (VICKS VAPORUB EX)   Nasal   Place 1 application into the nose every 4 (four) hours as needed (nasal congestion).          . citalopram (  CELEXA) 10 MG tablet      TAKE (1) TABLET BY MOUTH EACH MORNING.   30 tablet   5   . Cranberry 425 MG CAPS   Oral   Take 1 capsule by mouth daily.         Marland Kitchen lisinopril (PRINIVIL,ZESTRIL) 5 MG tablet      TAKE 1 TABLET BY MOUTH EVERY MORNING.   30 tablet   5   . loperamide (IMODIUM) 2 MG capsule      TAKE 1 CAPSULE BY MOUTH EVERY 6 HOURS AS NEEDED FOR DIARRHEA.   30 capsule   2   . Multiple Vitamin (DAILY VITE) TABS      TAKE 1 TABLET BY MOUTH ONCE DAILY.   30 tablet   5   . OLANZapine (ZYPREXA) 2.5 MG tablet   Oral   Take 2.5 mg by mouth at bedtime.         . ondansetron (ZOFRAN ODT) 4 MG disintegrating tablet   Oral   Take 1 tablet (4 mg total) by mouth every 8 (eight) hours as needed for nausea.   20 tablet   0   . Polyethyl Glycol-Propyl Glycol 0.4-0.3 % SOLN      NSTILL 1 DROP INTO EACH EYE 4 TIMES DAILY.   10 mL   1   . polyethylene glycol (MIRALAX / GLYCOLAX) packet   Oral    Take 8.5 g by mouth daily.         . SENNALAX-S 8.6-50 MG per tablet      TAKE 1 TABLET BY MOUTH ONCE DAILY AS NEEDED FOR CONSTIPATION.   30 tablet   1   . STOOL SOFTENER 100 MG capsule      TAKE 1 CAPSULE BY MOUTH ONCE DAILY.   30 capsule   5   . white petrolatum (VASELINE) GEL   Topical   Apply 1 application topically 2 (two) times daily as needed. Apply with q-tip bilaterally to nasal septum         . Witch Hazel (PREPARATION H EX)   Apply externally   Apply 1 application topically 4 (four) times daily as needed (hemorrhoidal irritation).           Triage Vitals: BP 162/85  Pulse 72  Temp(Src) 98.2 F (36.8 C) (Oral)  Resp 20  SpO2 98%  Physical Exam  Nursing note and vitals reviewed. Constitutional: She appears well-developed and well-nourished. No distress.  HENT:  Head: Normocephalic and atraumatic.  Eyes: Conjunctivae and EOM are normal. Pupils are equal, round, and reactive to light.  Neck: Neck supple. No tracheal deviation present.  Cardiovascular: Normal rate, regular rhythm and normal heart sounds.   Pulmonary/Chest: Effort normal and breath sounds normal. No respiratory distress.  Abdominal: Soft. She exhibits no distension. There is no tenderness.  Musculoskeletal: Normal range of motion.  Neurological: She is alert. She has normal strength and normal reflexes. No cranial nerve deficit or sensory deficit. She exhibits normal muscle tone. GCS eye subscore is 4. GCS verbal subscore is 4. GCS motor subscore is 6.  Strength grossly intact in all 4 extremities  Skin: Skin is warm and dry.  Psychiatric: She has a normal mood and affect. Her behavior is normal.    ED Course  Procedures (including critical care time)   DIAGNOSTIC STUDIES:  Oxygen Saturation is 98% on room air, normal by my interpretation.    COORDINATION OF CARE:  3:13 PM -Will order EKG, chest x-ray, CBC, BMP, urinalysis,  troponin and chewable aspirin. Patient verbalizes  understanding and agrees with treatment plan.  Medications  aspirin chewable tablet 324 mg (not administered)     Date: 06/15/2013  Rate: 71  Rhythm: normal sinus rhythm  QRS Axis: normal  Intervals: normal  ST/T Wave abnormalities: T wave inversions AVL  Conduction Disutrbances:none  Narrative Interpretation: Unchanged T wave inversions, otherwise normal  Old EKG Reviewed: unchanged   Labs Review Labs Reviewed  CBC - Abnormal; Notable for the following:    RBC 3.64 (*)    Hemoglobin 11.2 (*)    HCT 34.7 (*)    All other components within normal limits  BASIC METABOLIC PANEL - Abnormal; Notable for the following:    Glucose, Bld 113 (*)    GFR calc non Af Amer 50 (*)    GFR calc Af Amer 58 (*)    All other components within normal limits  URINALYSIS, ROUTINE W REFLEX MICROSCOPIC - Abnormal; Notable for the following:    Hgb urine dipstick TRACE (*)    All other components within normal limits  URINE MICROSCOPIC-ADD ON - Abnormal; Notable for the following:    Bacteria, UA FEW (*)    All other components within normal limits  URINE CULTURE  POCT I-STAT TROPONIN I   Imaging Review Dg Chest 1 View  06/15/2013   CLINICAL DATA:  Fatigue.  EXAM: CHEST - 1 VIEW  COMPARISON:  05/21/2011  FINDINGS: Mild hyperinflation. Scarring or atelectasis in the left base. Right lung is clear. No effusions. Advanced degenerative changes in the shoulders bilaterally.  IMPRESSION: Mild hyperinflation. Left base scarring or atelectasis.   Electronically Signed   By: Charlett Nose M.D.   On: 06/15/2013 16:12    MDM   1. Fatigue    I discussed options for testing and treatment of transient chest pain (unable to get a good history of it from patient), including observation in hospital for serial troponins and telemetry but patient and POA state they'd rather go home if there is no evidence of an acute MI. They do not want aggressive treatment at this time. They understand the risks of going home.  Otherwise it appears her generalized fatigue is not new and there are no new electrolyte or urinary disturbances. I discussed that the initial troponin is negative and that typically a second one would be performed, but POA would rather go home with follow up with her physician as they are not wanting aggressive treatment at this time. I discussed return precautions with her and she verbalized understanding.  I personally performed the services described in this documentation, which was scribed in my presence. The recorded information has been reviewed and is accurate.       Audree Camel, MD 06/15/13 [phone]

## 2013-06-16 ENCOUNTER — Ambulatory Visit (INDEPENDENT_AMBULATORY_CARE_PROVIDER_SITE_OTHER): Payer: Medicare Other | Admitting: Otolaryngology

## 2013-06-16 ENCOUNTER — Other Ambulatory Visit: Payer: Self-pay | Admitting: Family Medicine

## 2013-06-16 DIAGNOSIS — H6040 Cholesteatoma of external ear, unspecified ear: Secondary | ICD-10-CM

## 2013-06-16 DIAGNOSIS — H7190 Unspecified cholesteatoma, unspecified ear: Secondary | ICD-10-CM

## 2013-06-17 LAB — URINE CULTURE

## 2013-06-22 ENCOUNTER — Other Ambulatory Visit: Payer: Self-pay | Admitting: Family Medicine

## 2013-06-24 ENCOUNTER — Encounter: Payer: Self-pay | Admitting: Family Medicine

## 2013-06-24 ENCOUNTER — Ambulatory Visit (INDEPENDENT_AMBULATORY_CARE_PROVIDER_SITE_OTHER): Payer: Medicare Other | Admitting: Family Medicine

## 2013-06-24 VITALS — BP 166/88 | Ht 63.0 in | Wt 121.0 lb

## 2013-06-24 DIAGNOSIS — I10 Essential (primary) hypertension: Secondary | ICD-10-CM

## 2013-06-24 DIAGNOSIS — M7989 Other specified soft tissue disorders: Secondary | ICD-10-CM

## 2013-06-24 DIAGNOSIS — F0390 Unspecified dementia without behavioral disturbance: Secondary | ICD-10-CM

## 2013-06-24 NOTE — Progress Notes (Signed)
  Subjective:    Patient ID: Wendy Coleman, female    DOB: 1911-09-17, 79 y.o.   MRN: [id number]  HPI Patient and POA are here today to f/u from ER visit last week. She went to the ER b/c she had tightness in her chest and trouble breathing. They did xrays and an EKG and everything looked WNL.  Has right leg weakness. The weakness might actually be due to discomfort. Recalls no injury.  She also has nasal congestion that started last night. Miserable with it 2 days duration.  Urinalysis done by nursing Center. I advised caretaker the nursing Center does this frequently. This is challenging because most institutionalized women have some low-grade bladder infection at any given time.   Caretaker reports dementia fairly stable., With slow decline over time   Review of Systems No current chest pain no back pain no shortness of breath    Objective:   Physical Exam  Alert no acute distress. HEENT normal except for nasal congestion. Lungs clear. Heart regular rate and rhythm. Moderate malaise. Ankles no significant edema no leg tenderness  Emergency room notes reviewed      Assessment & Plan:  Impression 1 upper respiratory infection #2 dementia some progress unfortunately #3 recent chest pain resolved #4 pending urine culture plan symptomatic care discussed warning signs discussed maintain same meds 25 minutes spent most in discussion. WSL

## 2013-06-30 ENCOUNTER — Other Ambulatory Visit (HOSPITAL_COMMUNITY): Payer: Self-pay | Admitting: Family Medicine

## 2013-07-07 ENCOUNTER — Other Ambulatory Visit: Payer: Self-pay | Admitting: Family Medicine

## 2013-07-23 ENCOUNTER — Emergency Department (HOSPITAL_COMMUNITY)
Admission: EM | Admit: 2013-07-23 | Discharge: 2013-07-23 | Disposition: A | Discharge status: Home / Self Care | Payer: Medicare Other | Attending: Emergency Medicine | Admitting: Emergency Medicine

## 2013-07-23 ENCOUNTER — Encounter (HOSPITAL_COMMUNITY): Payer: Self-pay | Admitting: Emergency Medicine

## 2013-07-23 ENCOUNTER — Emergency Department (HOSPITAL_COMMUNITY): Payer: Medicare Other

## 2013-07-23 DIAGNOSIS — M129 Arthropathy, unspecified: Secondary | ICD-10-CM | POA: Insufficient documentation

## 2013-07-23 DIAGNOSIS — H409 Unspecified glaucoma: Secondary | ICD-10-CM | POA: Insufficient documentation

## 2013-07-23 DIAGNOSIS — Z79899 Other long term (current) drug therapy: Secondary | ICD-10-CM | POA: Insufficient documentation

## 2013-07-23 DIAGNOSIS — I10 Essential (primary) hypertension: Secondary | ICD-10-CM | POA: Insufficient documentation

## 2013-07-23 DIAGNOSIS — Z86718 Personal history of other venous thrombosis and embolism: Secondary | ICD-10-CM | POA: Insufficient documentation

## 2013-07-23 DIAGNOSIS — F0390 Unspecified dementia without behavioral disturbance: Secondary | ICD-10-CM | POA: Insufficient documentation

## 2013-07-23 DIAGNOSIS — Z7982 Long term (current) use of aspirin: Secondary | ICD-10-CM | POA: Insufficient documentation

## 2013-07-23 DIAGNOSIS — Z8781 Personal history of (healed) traumatic fracture: Secondary | ICD-10-CM | POA: Insufficient documentation

## 2013-07-23 DIAGNOSIS — F419 Anxiety disorder, unspecified: Secondary | ICD-10-CM

## 2013-07-23 DIAGNOSIS — Z862 Personal history of diseases of the blood and blood-forming organs and certain disorders involving the immune mechanism: Secondary | ICD-10-CM | POA: Insufficient documentation

## 2013-07-23 DIAGNOSIS — F411 Generalized anxiety disorder: Secondary | ICD-10-CM | POA: Insufficient documentation

## 2013-07-23 LAB — BASIC METABOLIC PANEL
CO2: 28 mEq/L (ref 19–32)
Calcium: 9.7 mg/dL (ref 8.4–10.5)
Creatinine, Ser: 0.92 mg/dL (ref 0.50–1.10)
GFR calc non Af Amer: 49 mL/min — ABNORMAL LOW (ref >90)
Glucose, Bld: 102 mg/dL — ABNORMAL HIGH (ref 70–99)
Potassium: 4 mEq/L (ref 3.5–5.1)

## 2013-07-23 LAB — CBC WITH DIFFERENTIAL/PLATELET
Basophils Absolute: 0 10*3/uL (ref 0.0–0.1)
Basophils Relative: 0 % (ref 0–1)
Eosinophils Absolute: 0.1 10*3/uL (ref 0.0–0.7)
Eosinophils Relative: 2 % (ref 0–5)
HCT: 33.4 % — ABNORMAL LOW (ref 36.0–46.0)
Lymphocytes Relative: 24 % (ref 12–46)
MCH: 30.9 pg (ref 26.0–34.0)
MCHC: 32.3 g/dL (ref 30.0–36.0)
MCV: 95.7 fL (ref 78.0–100.0)
Monocytes Absolute: 0.5 10*3/uL (ref 0.1–1.0)
Neutro Abs: 4.5 10*3/uL (ref 1.7–7.7)
Neutrophils Relative %: 67 % (ref 43–77)
Platelets: 247 10*3/uL (ref 150–400)
RDW: 14.5 % (ref 11.5–15.5)

## 2013-07-23 LAB — URINALYSIS, ROUTINE W REFLEX MICROSCOPIC
Bilirubin Urine: NEGATIVE
Glucose, UA: NEGATIVE mg/dL
Hgb urine dipstick: NEGATIVE
Ketones, ur: NEGATIVE mg/dL
Protein, ur: NEGATIVE mg/dL
Urobilinogen, UA: 0.2 mg/dL (ref 0.0–1.0)

## 2013-07-23 MED ORDER — LORAZEPAM 1 MG PO TABS: 0.5000 mg | ORAL_TABLET | Freq: Three times a day (TID) | ORAL | Status: DC | PRN

## 2013-07-23 NOTE — ED Notes (Signed)
Pt in xray dept. At this time

## 2013-07-23 NOTE — ED Notes (Signed)
Pt arrived to er with friends from [facility] with request for blood work, caretaker of pt with her states that pt had become combative with staff this am, thinking that someone had broken into her room at [facility] and was trying to kill her, caregiver reports that pt had problems with her speech two days ago, was normal yesterday, pt has facial droop noted to mouth area according to friend (POA) and caregiver at bedside this is normal for pt. Pt able to recognize who is with her, states that she is at a hospital, caregiver states that pt is normal at present time in triage, pt states "I feel terrible" unable to state why.

## 2013-07-23 NOTE — ED Provider Notes (Signed)
CSN: [id number]     Arrival date & time 07/23/13  1116 History  This chart was scribed for Benny Lennert, MD by Bennett Scrape, ED Scribe. This patient was seen in room APA16/APA16 and the patient's care was started at 11:49 AM.   Chief Complaint  Patient presents with  . Altered Mental Status    The history is provided by a caregiver and a friend. No language interpreter was used.    HPI Comments: Wendy Coleman is a 79 y.o. female who presents to the Emergency Department from [facility] for AMS. Pt's friend and caregiver states that the pt was combative, kicking and scratching at staff. She then stated that the staff was trying to kill her. Friend and caregiver state that the pt's baseline is cooperative and "docile" but mildly confused. Staff at [facility] asked friend and caregiver to transport the pt to the ED for blood work and an UA.   Past Medical History  Diagnosis Date  . Hypertension   . Dementia   . Glaucoma   . Hip fracture, left   . Cellulitis   . Anemia   . Left leg DVT   . Anxiety   . Insomnia   . Arthritis   . Chronic ear infection    Past Surgical History  Procedure Laterality Date  . Orif hip fracture  05/23/2011    Procedure: OPEN REDUCTION INTERNAL FIXATION HIP;  Surgeon: Fuller Canada, MD;  Location: [facility];  Service: Orthopedics;  Laterality: Left;  with Gamma Nail  . Hip arthrotomy    . Abdominal hysterectomy     No family history on file. History  Substance Use Topics  . Smoking status: Never Smoker   . Smokeless tobacco: Never Used  . Alcohol Use: No   No OB history provided.  Review of Systems  Unable to perform ROS: Mental status change    Allergies  Review of patient's allergies indicates no known allergies.  Home Medications   Current Outpatient Rx  Name  Route  Sig  Dispense  Refill  . acetaminophen (TYLENOL) 500 MG tablet   Oral   Take 1,000 mg by mouth every 4 (four) hours as needed. For pain not exceeding 2  doses with 24 hours          . aspirin EC 81 MG tablet   Oral   Take 81 mg by mouth daily.         . bimatoprost (LUMIGAN) 0.01 % SOLN   Ophthalmic   Apply 1 drop to eye at bedtime.           . Camphor-Eucalyptus-Menthol (VICKS VAPORUB EX)   Nasal   Place 1 application into the nose every 4 (four) hours as needed (nasal congestion).          . Camphor-Eucalyptus-Menthol (VICKS VAPORUB) 4.7-1.2-2.6 % OINT      APPLY A SMALL AMOUNT TO INSIDE OF EACH NOSTRIL EVERY 4 HOURS AS NEEDED FOR NASAL CONGESTION   50 g   5   . CIPRODEX otic suspension               . citalopram (CELEXA) 10 MG tablet   Oral   Take 10 mg by mouth every morning.         . Cranberry 425 MG CAPS      TAKE 1 CAPSULE BY MOUTH ONCE DAILY.   30 capsule   5   . docusate sodium (COLACE) 100 MG capsule  Oral   Take 100 mg by mouth every morning.         Marland Kitchen glycerin adult (GLYCERIN ADULT) 2 G SUPP   Rectal   Place 1 suppository rectally daily as needed (for constipation).         Marland Kitchen lisinopril (PRINIVIL,ZESTRIL) 5 MG tablet   Oral   Take 5 mg by mouth daily.         Marland Kitchen loperamide (IMODIUM) 2 MG capsule               . Multiple Vitamin (MULTIVITAMIN WITH MINERALS) TABS tablet   Oral   Take 1 tablet by mouth daily.         Marland Kitchen OLANZapine (ZYPREXA) 2.5 MG tablet      TAKE (1) TABLET BY MOUTH AT BEDTIME.   30 tablet   4     Dispense as written.   . ondansetron (ZOFRAN ODT) 4 MG disintegrating tablet   Oral   Take 1 tablet (4 mg total) by mouth every 8 (eight) hours as needed for nausea.   20 tablet   0   . Polyethyl Glycol-Propyl Glycol (SYSTANE OP)   Ophthalmic   Apply 1 drop to eye 4 (four) times daily.         . polyethylene glycol (MIRALAX / GLYCOLAX) packet   Oral   Take 8.5 g by mouth daily.         . polyethylene glycol powder (GLYCOLAX/MIRALAX) powder      MIX 1 CAPFUL (17 G) IN 8 OUNCES OF JUICE/WATER AND DRINK ONCE DAILY.   527 g   4   .  senna-docusate (SENOKOT S) 8.6-50 MG per tablet   Oral   Take 1 tablet by mouth daily as needed for constipation.         . white petrolatum (VASELINE) GEL   Topical   Apply 1 application topically 2 (two) times daily as needed. Apply with q-tip bilaterally to nasal septum          Triage Vitals: BP 171/109  Pulse 71  Temp(Src) 97.3 F (36.3 C) (Axillary)  Resp 20  SpO2 98%  Physical Exam  Nursing note and vitals reviewed. Constitutional:  Elderly, frail woman  HENT:  Head: Normocephalic and atraumatic.  Eyes: Conjunctivae and EOM are normal. No scleral icterus.  Neck: Neck supple. No thyromegaly present.  Cardiovascular: Normal rate and regular rhythm.  Exam reveals no gallop and no friction rub.   No murmur heard. Pulmonary/Chest: Effort normal and breath sounds normal. No stridor. She has no wheezes. She has no rales. She exhibits no tenderness.  Abdominal: She exhibits no distension. There is no tenderness. There is no rebound.  Musculoskeletal: Normal range of motion. She exhibits no edema.  Lymphadenopathy:    She has no cervical adenopathy.  Neurological: She is alert. She exhibits normal muscle tone. Coordination normal.  Skin: Skin is warm and dry. No rash noted. No erythema.  Psychiatric: She has a normal mood and affect. Her behavior is normal.    ED Course  Procedures (including critical care time)  DIAGNOSTIC STUDIES: Oxygen Saturation is 98% on room air, normal by my interpretation.    COORDINATION OF CARE: 11:51 AM-Discussed treatment plan which includes Ct of head, CXR, CBC panel, BMP and UA with pt's friend and caregiver at bedside and both agreed to plan.   Labs Review Labs Reviewed - No data to display Imaging Review No results found.  EKG Interpretation   None  MDM  No diagnosis found.   The chart was scribed for me under my direct supervision.  I personally performed the history, physical, and medical decision making and all  procedures in the evaluation of this patient.Benny Lennert, MD 07/23/13 [phone]

## 2013-08-18 ENCOUNTER — Encounter (INDEPENDENT_AMBULATORY_CARE_PROVIDER_SITE_OTHER): Payer: Self-pay

## 2013-08-18 ENCOUNTER — Ambulatory Visit (INDEPENDENT_AMBULATORY_CARE_PROVIDER_SITE_OTHER): Payer: Medicare Other | Admitting: Otolaryngology

## 2013-08-18 DIAGNOSIS — H7190 Unspecified cholesteatoma, unspecified ear: Secondary | ICD-10-CM

## 2013-09-13 ENCOUNTER — Encounter (HOSPITAL_COMMUNITY): Payer: Self-pay | Admitting: Emergency Medicine

## 2013-09-13 ENCOUNTER — Emergency Department (HOSPITAL_COMMUNITY)
Admission: EM | Admit: 2013-09-13 | Discharge: 2013-09-13 | Disposition: A | Discharge status: Home / Self Care | Payer: Medicare Other | Attending: Emergency Medicine | Admitting: Emergency Medicine

## 2013-09-13 ENCOUNTER — Emergency Department (HOSPITAL_COMMUNITY): Payer: Medicare Other

## 2013-09-13 DIAGNOSIS — M129 Arthropathy, unspecified: Secondary | ICD-10-CM | POA: Insufficient documentation

## 2013-09-13 DIAGNOSIS — R296 Repeated falls: Secondary | ICD-10-CM | POA: Insufficient documentation

## 2013-09-13 DIAGNOSIS — Z872 Personal history of diseases of the skin and subcutaneous tissue: Secondary | ICD-10-CM | POA: Insufficient documentation

## 2013-09-13 DIAGNOSIS — Z7982 Long term (current) use of aspirin: Secondary | ICD-10-CM | POA: Insufficient documentation

## 2013-09-13 DIAGNOSIS — Z79899 Other long term (current) drug therapy: Secondary | ICD-10-CM | POA: Insufficient documentation

## 2013-09-13 DIAGNOSIS — Y929 Unspecified place or not applicable: Secondary | ICD-10-CM | POA: Insufficient documentation

## 2013-09-13 DIAGNOSIS — F411 Generalized anxiety disorder: Secondary | ICD-10-CM | POA: Insufficient documentation

## 2013-09-13 DIAGNOSIS — Y939 Activity, unspecified: Secondary | ICD-10-CM | POA: Insufficient documentation

## 2013-09-13 DIAGNOSIS — H409 Unspecified glaucoma: Secondary | ICD-10-CM | POA: Insufficient documentation

## 2013-09-13 DIAGNOSIS — S129XXA Fracture of neck, unspecified, initial encounter: Secondary | ICD-10-CM | POA: Insufficient documentation

## 2013-09-13 DIAGNOSIS — Z862 Personal history of diseases of the blood and blood-forming organs and certain disorders involving the immune mechanism: Secondary | ICD-10-CM | POA: Insufficient documentation

## 2013-09-13 DIAGNOSIS — G4700 Insomnia, unspecified: Secondary | ICD-10-CM | POA: Insufficient documentation

## 2013-09-13 DIAGNOSIS — F0390 Unspecified dementia without behavioral disturbance: Secondary | ICD-10-CM | POA: Insufficient documentation

## 2013-09-13 DIAGNOSIS — W19XXXA Unspecified fall, initial encounter: Secondary | ICD-10-CM

## 2013-09-13 DIAGNOSIS — Z86718 Personal history of other venous thrombosis and embolism: Secondary | ICD-10-CM | POA: Insufficient documentation

## 2013-09-13 DIAGNOSIS — Z8781 Personal history of (healed) traumatic fracture: Secondary | ICD-10-CM | POA: Insufficient documentation

## 2013-09-13 DIAGNOSIS — I10 Essential (primary) hypertension: Secondary | ICD-10-CM | POA: Insufficient documentation

## 2013-09-13 NOTE — ED Provider Notes (Signed)
CSN: [id number]     Arrival date & time 09/13/13  0222 History   First MD Initiated Contact with Patient 09/13/13 0359     Chief Complaint  Patient presents with  . Fall   (Consider location/radiation/quality/duration/timing/severity/associated sxs/prior Treatment) Patient is a 79 y.o. female presenting with fall. The history is provided by the patient, the EMS personnel and the nursing home. The history is limited by the condition of the patient.  Fall Associated symptoms include headaches.   patient down on the floor at nursing facility. Patient at the nursing services complaining of some headache. The back of her head. Patient has significant dementia which is baseline. Upon arrival here patient without any complaints at all. Was brought into on backboard and a c-collar board was removed upon arrival. C-collar kept in place waiting for CT scan results.  Level V caveat applies to the history due to her history of dementia.    Past Medical History  Diagnosis Date  . Hypertension   . Dementia   . Glaucoma   . Hip fracture, left   . Cellulitis   . Anemia   . Left leg DVT   . Anxiety   . Insomnia   . Arthritis   . Chronic ear infection    Past Surgical History  Procedure Laterality Date  . Orif hip fracture  05/23/2011    Procedure: OPEN REDUCTION INTERNAL FIXATION HIP;  Surgeon: Fuller Canada, MD;  Location: [facility];  Service: Orthopedics;  Laterality: Left;  with Gamma Nail  . Hip arthrotomy    . Abdominal hysterectomy     History reviewed. No pertinent family history. History  Substance Use Topics  . Smoking status: Never Smoker   . Smokeless tobacco: Never Used  . Alcohol Use: No   OB History   Grav Para Term Preterm Abortions TAB SAB Ect Mult Living            0     Review of Systems  Unable to perform ROS HENT: Negative for congestion.   Neurological: Positive for headaches.   the patient is awake but there is some confusion. Currently not complaining of  anything at the nursing facility she was complaining about a mild headache. Brought in a c-collar by EMS. Patient's mental status is baseline as per family member.  Allergies  Review of patient's allergies indicates no known allergies.  Home Medications   Current Outpatient Rx  Name  Route  Sig  Dispense  Refill  . aspirin EC 81 MG tablet   Oral   Take 81 mg by mouth daily.         . bimatoprost (LUMIGAN) 0.01 % SOLN   Ophthalmic   Apply 1 drop to eye at bedtime.           . Camphor-Eucalyptus-Menthol (VICKS VAPORUB) 4.7-1.2-2.6 % OINT      APPLY A SMALL AMOUNT TO INSIDE OF EACH NOSTRIL EVERY 4 HOURS AS NEEDED FOR NASAL CONGESTION   50 g   5   . citalopram (CELEXA) 10 MG tablet   Oral   Take 10 mg by mouth every morning.         . Cranberry 425 MG CAPS      TAKE 1 CAPSULE BY MOUTH ONCE DAILY.   30 capsule   5   . docusate sodium (COLACE) 100 MG capsule   Oral   Take 100 mg by mouth every morning.         Marland Kitchen glycerin adult (  GLYCERIN ADULT) 2 G SUPP   Rectal   Place 1 suppository rectally daily as needed (for constipation).         Marland Kitchen lisinopril (PRINIVIL,ZESTRIL) 5 MG tablet   Oral   Take 5 mg by mouth daily.         Marland Kitchen loperamide (IMODIUM) 2 MG capsule   Oral   Take 2 mg by mouth every 6 (six) hours as needed for diarrhea or loose stools.          Marland Kitchen LORazepam (ATIVAN) 1 MG tablet   Oral   Take 0.5 tablets (0.5 mg total) by mouth every 8 (eight) hours as needed for anxiety.   15 tablet   0   . Multiple Vitamin (MULTIVITAMIN WITH MINERALS) TABS tablet   Oral   Take 1 tablet by mouth daily.         Marland Kitchen OLANZapine (ZYPREXA) 2.5 MG tablet      TAKE (1) TABLET BY MOUTH AT BEDTIME.   30 tablet   4     Dispense as written.   Marland Kitchen Phenylephrine HCl (AFRIN ALLERGY NA)   Nasal   Place 2 sprays into the nose as needed (nasal congestion).         Bertram Gala Glycol-Propyl Glycol (SYSTANE OP)   Ophthalmic   Apply 1 drop to eye 4 (four) times  daily.         . polyethylene glycol powder (GLYCOLAX/MIRALAX) powder      MIX 1 CAPFUL (17 G) IN 8 OUNCES OF JUICE/WATER AND DRINK ONCE DAILY.   527 g   4   . senna-docusate (SENOKOT S) 8.6-50 MG per tablet   Oral   Take 1 tablet by mouth daily as needed for constipation.         . white petrolatum (VASELINE) GEL   Topical   Apply 1 application topically 2 (two) times daily as needed. Apply with q-tip bilaterally to nasal septum          BP 173/81  Pulse 65  Temp(Src) 97.7 F (36.5 C) (Oral)  SpO2 97% Physical Exam  Nursing note and vitals reviewed. Constitutional: She appears well-developed and well-nourished. No distress.  HENT:  Head: Normocephalic and atraumatic.  Mouth/Throat: Oropharynx is clear and moist.  Eyes: Conjunctivae and EOM are normal. Pupils are equal, round, and reactive to light.  Neck: Normal range of motion.  Cardiovascular: Normal rate, regular rhythm and normal heart sounds.   No murmur heard. Pulmonary/Chest: Effort normal and breath sounds normal. No respiratory distress.  Abdominal: Soft. Bowel sounds are normal. There is no tenderness.  Musculoskeletal: Normal range of motion. She exhibits no tenderness.  Full range of motion of both lower extremities spontaneously no evidence of any hip fracture.  Neurological: She is alert. No cranial nerve deficit. She exhibits normal muscle tone. Coordination normal.  The patient is awake but confused.  Skin: Skin is warm. No rash noted.    ED Course  Procedures (including critical care time) Labs Review Labs Reviewed - No data to display Imaging Review Ct Head Wo Contrast  09/13/2013   CLINICAL DATA:  Hypertension, dementia.  EXAM: CT HEAD WITHOUT CONTRAST  CT CERVICAL SPINE WITHOUT CONTRAST  TECHNIQUE: Multidetector CT imaging of the head and cervical spine was performed following the standard protocol without intravenous contrast. Multiplanar CT image reconstructions of the cervical spine were  also generated.  COMPARISON:  CT of the head July 23, 2013.  FINDINGS: CT HEAD FINDINGS  The ventricles  and sulci are normal for age. No intraparenchymal hemorrhage, mass effect nor midline shift. Patchy to confluent supratentorial white matter hypodensities are within normal range for patient's age and though non-specific suggest sequelae of chronic small vessel ischemic disease. No acute large vascular territory infarcts.  No abnormal extra-axial fluid collections. Basal cisterns are patent. Moderate calcific atherosclerosis of the carotid siphons and to lesser extent included vertebral arteries.  No skull fracture. Trace paranasal sinus mucosal thickening without air-fluid levels. The included ocular globes and orbital contents are non-suspicious, status post apparent bilateral ocular lens implants. Moderate temporomandibular osteoarthrosis.  CT CERVICAL SPINE FINDINGS  Cervical vertebral bodies are intact, with straightened cervical lordosis. However, mild T2 superior endplate compression fracture may be acute. Grade 1 C7-T1 anterolisthesis on degenerative basis. Severe C5-6, moderate to severe C6-7 degenerative disc disease. Bone mineral density is decreased without destructive bony lesions. C1-2 articulation maintained with severe arthropathy. Mild calcified pannus about the odontoid may reflect CPPD. Moderate calcific atherosclerosis of the right carotid bulb.  Degenerative disc disease and moderate to severe facet arthropathy result in mild to moderate canal stenosis C5-6, mild at C6-7. Severe C3-4 thru C6-7 neural foraminal narrowing.  IMPRESSION: CT Head: No acute intracranial process.  Involutional changes. Moderate white matter changes suggest chronic small vessel ischemic disease, similar.  CT cervical spine: Mild T2 superior endplate compression fracture may be acute.Grade 1 C7-T1 anterolisthesis on degenerative basis.  Degenerative change of the cervical spine results in mild to moderate canal  stenosis C5-6, mild at C6-7. Severe C3-4 thru C6-7 neural foraminal narrowing.   Electronically Signed   By: Awilda Metro   On: 09/13/2013 05:43   Ct Cervical Spine Wo Contrast  09/13/2013   CLINICAL DATA:  Hypertension, dementia.  EXAM: CT HEAD WITHOUT CONTRAST  CT CERVICAL SPINE WITHOUT CONTRAST  TECHNIQUE: Multidetector CT imaging of the head and cervical spine was performed following the standard protocol without intravenous contrast. Multiplanar CT image reconstructions of the cervical spine were also generated.  COMPARISON:  CT of the head July 23, 2013.  FINDINGS: CT HEAD FINDINGS  The ventricles and sulci are normal for age. No intraparenchymal hemorrhage, mass effect nor midline shift. Patchy to confluent supratentorial white matter hypodensities are within normal range for patient's age and though non-specific suggest sequelae of chronic small vessel ischemic disease. No acute large vascular territory infarcts.  No abnormal extra-axial fluid collections. Basal cisterns are patent. Moderate calcific atherosclerosis of the carotid siphons and to lesser extent included vertebral arteries.  No skull fracture. Trace paranasal sinus mucosal thickening without air-fluid levels. The included ocular globes and orbital contents are non-suspicious, status post apparent bilateral ocular lens implants. Moderate temporomandibular osteoarthrosis.  CT CERVICAL SPINE FINDINGS  Cervical vertebral bodies are intact, with straightened cervical lordosis. However, mild T2 superior endplate compression fracture may be acute. Grade 1 C7-T1 anterolisthesis on degenerative basis. Severe C5-6, moderate to severe C6-7 degenerative disc disease. Bone mineral density is decreased without destructive bony lesions. C1-2 articulation maintained with severe arthropathy. Mild calcified pannus about the odontoid may reflect CPPD. Moderate calcific atherosclerosis of the right carotid bulb.  Degenerative disc disease and moderate  to severe facet arthropathy result in mild to moderate canal stenosis C5-6, mild at C6-7. Severe C3-4 thru C6-7 neural foraminal narrowing.  IMPRESSION: CT Head: No acute intracranial process.  Involutional changes. Moderate white matter changes suggest chronic small vessel ischemic disease, similar.  CT cervical spine: Mild T2 superior endplate compression fracture may be acute.Grade 1 C7-T1 anterolisthesis  on degenerative basis.  Degenerative change of the cervical spine results in mild to moderate canal stenosis C5-6, mild at C6-7. Severe C3-4 thru C6-7 neural foraminal narrowing.   Electronically Signed   By: Awilda Metro   On: 09/13/2013 05:43    EKG Interpretation   None       MDM   1. Fall, initial encounter   2. Cervical spine fracture, initial encounter    Patient clinically without any neck pain. Suspected the T1 or the correction T2 superior endplate compression fracture may very well be old. Either way patient to be discharged back to nursing facility head CT was negative. Patient with no outward evidence of injury. Patient does have dementia that is baseline no significant changes.    Shelda Jakes, MD 09/13/13 [phone]

## 2013-09-13 NOTE — ED Notes (Signed)
Pt found sitting on floor next to bed, unknown fall with no apparent new complaints of pain except for "mild occipital pain". Pt with baseline dementia and right shoulder pain per report. C-collar applied on scene by EMS.

## 2013-09-13 NOTE — ED Notes (Addendum)
Patient placed on bedpan per request.  

## 2013-09-13 NOTE — ED Notes (Signed)
Patient placed on bedpan.  Patient denies pain at this time.

## 2013-09-26 ENCOUNTER — Emergency Department (HOSPITAL_COMMUNITY)
Admission: EM | Admit: 2013-09-26 | Discharge: 2013-09-26 | Disposition: A | Discharge status: Home / Self Care | Payer: Medicare Other | Attending: Emergency Medicine | Admitting: Emergency Medicine

## 2013-09-26 ENCOUNTER — Encounter (HOSPITAL_COMMUNITY): Payer: Self-pay | Admitting: Emergency Medicine

## 2013-09-26 ENCOUNTER — Other Ambulatory Visit: Payer: Self-pay | Admitting: Family Medicine

## 2013-09-26 DIAGNOSIS — M129 Arthropathy, unspecified: Secondary | ICD-10-CM | POA: Insufficient documentation

## 2013-09-26 DIAGNOSIS — W19XXXA Unspecified fall, initial encounter: Secondary | ICD-10-CM

## 2013-09-26 DIAGNOSIS — Z79899 Other long term (current) drug therapy: Secondary | ICD-10-CM | POA: Insufficient documentation

## 2013-09-26 DIAGNOSIS — IMO0002 Reserved for concepts with insufficient information to code with codable children: Secondary | ICD-10-CM | POA: Insufficient documentation

## 2013-09-26 DIAGNOSIS — W06XXXA Fall from bed, initial encounter: Secondary | ICD-10-CM | POA: Insufficient documentation

## 2013-09-26 DIAGNOSIS — Z7982 Long term (current) use of aspirin: Secondary | ICD-10-CM | POA: Insufficient documentation

## 2013-09-26 DIAGNOSIS — F411 Generalized anxiety disorder: Secondary | ICD-10-CM | POA: Insufficient documentation

## 2013-09-26 DIAGNOSIS — F99 Mental disorder, not otherwise specified: Secondary | ICD-10-CM | POA: Insufficient documentation

## 2013-09-26 DIAGNOSIS — I10 Essential (primary) hypertension: Secondary | ICD-10-CM | POA: Insufficient documentation

## 2013-09-26 DIAGNOSIS — Z872 Personal history of diseases of the skin and subcutaneous tissue: Secondary | ICD-10-CM | POA: Insufficient documentation

## 2013-09-26 DIAGNOSIS — S0990XA Unspecified injury of head, initial encounter: Secondary | ICD-10-CM | POA: Insufficient documentation

## 2013-09-26 DIAGNOSIS — F0390 Unspecified dementia without behavioral disturbance: Secondary | ICD-10-CM | POA: Insufficient documentation

## 2013-09-26 DIAGNOSIS — Z8781 Personal history of (healed) traumatic fracture: Secondary | ICD-10-CM | POA: Insufficient documentation

## 2013-09-26 DIAGNOSIS — Z862 Personal history of diseases of the blood and blood-forming organs and certain disorders involving the immune mechanism: Secondary | ICD-10-CM | POA: Insufficient documentation

## 2013-09-26 DIAGNOSIS — Y9389 Activity, other specified: Secondary | ICD-10-CM | POA: Insufficient documentation

## 2013-09-26 DIAGNOSIS — Y92009 Unspecified place in unspecified non-institutional (private) residence as the place of occurrence of the external cause: Secondary | ICD-10-CM | POA: Insufficient documentation

## 2013-09-26 DIAGNOSIS — G4700 Insomnia, unspecified: Secondary | ICD-10-CM | POA: Insufficient documentation

## 2013-09-26 DIAGNOSIS — H409 Unspecified glaucoma: Secondary | ICD-10-CM | POA: Insufficient documentation

## 2013-09-26 DIAGNOSIS — Z86718 Personal history of other venous thrombosis and embolism: Secondary | ICD-10-CM | POA: Insufficient documentation

## 2013-09-26 NOTE — ED Notes (Signed)
Discharge instructions reviewed with pt, questions answered. Pt verbalized understanding.  

## 2013-09-26 NOTE — ED Provider Notes (Signed)
CSN: [id number]     Arrival date & time 09/26/13  1733 History  This chart was scribed for Gerhard Munch, MD by Karle Plumber, ED Scribe. This patient was seen in room [facility] and the patient's care was started at 5:47 PM.  Chief Complaint  Patient presents with  . Fall   The history is provided by the patient and the nursing home. No language interpreter was used.   HPI Comments:  Wendy Coleman is a 79 y.o. female with h/o dementia, brought in by ambulance, who presents to the Emergency Department complaining of a fall at [facility]. The nursing home representative states pt got tangled up and fell trying to get in the bed. She states the pt's head may have slightly hit the ground. The pt states she is confused and disoriented and does not remember falling but this is normal at baseline secondary to dementia according to nursing home. The pt denies pain. Nursing home representative denies LOC.    Past Medical History  Diagnosis Date  . Hypertension   . Dementia   . Glaucoma   . Hip fracture, left   . Cellulitis   . Anemia   . Left leg DVT   . Anxiety   . Insomnia   . Arthritis   . Chronic ear infection    Past Surgical History  Procedure Laterality Date  . Orif hip fracture  05/23/2011    Procedure: OPEN REDUCTION INTERNAL FIXATION HIP;  Surgeon: Fuller Canada, MD;  Location: [facility];  Service: Orthopedics;  Laterality: Left;  with Gamma Nail  . Hip arthrotomy    . Abdominal hysterectomy     History reviewed. No pertinent family history. History  Substance Use Topics  . Smoking status: Never Smoker   . Smokeless tobacco: Never Used  . Alcohol Use: No   OB History   Grav Para Term Preterm Abortions TAB SAB Ect Mult Living            0     Review of Systems  Constitutional:       Per HPI, otherwise negative  HENT:       Per HPI, otherwise negative  Respiratory:       Per HPI, otherwise negative  Cardiovascular:       Per HPI, otherwise  negative  Gastrointestinal: Negative for vomiting.  Endocrine:       Negative aside from HPI  Genitourinary:       Neg aside from HPI   Musculoskeletal:       Per HPI, otherwise negative  Skin: Negative.   Neurological: Negative for syncope.    Allergies  Review of patient's allergies indicates no known allergies.  Home Medications   Current Outpatient Rx  Name  Route  Sig  Dispense  Refill  . aspirin EC 81 MG tablet   Oral   Take 81 mg by mouth daily.         . bimatoprost (LUMIGAN) 0.01 % SOLN   Ophthalmic   Apply 1 drop to eye at bedtime.           . Camphor-Eucalyptus-Menthol (VICKS VAPORUB) 4.7-1.2-2.6 % OINT      APPLY A SMALL AMOUNT TO INSIDE OF EACH NOSTRIL EVERY 4 HOURS AS NEEDED FOR NASAL CONGESTION   50 g   5   . citalopram (CELEXA) 10 MG tablet   Oral   Take 10 mg by mouth every morning.         Marland Kitchen  Cranberry 425 MG CAPS      TAKE 1 CAPSULE BY MOUTH ONCE DAILY.   30 capsule   5   . docusate sodium (COLACE) 100 MG capsule   Oral   Take 100 mg by mouth every morning.         Marland Kitchen glycerin adult (GLYCERIN ADULT) 2 G SUPP   Rectal   Place 1 suppository rectally daily as needed (for constipation).         Marland Kitchen lisinopril (PRINIVIL,ZESTRIL) 5 MG tablet   Oral   Take 5 mg by mouth daily.         Marland Kitchen loperamide (IMODIUM) 2 MG capsule   Oral   Take 2 mg by mouth every 6 (six) hours as needed for diarrhea or loose stools.          Marland Kitchen LORazepam (ATIVAN) 1 MG tablet   Oral   Take 0.5 tablets (0.5 mg total) by mouth every 8 (eight) hours as needed for anxiety.   15 tablet   0   . Multiple Vitamin (MULTIVITAMIN WITH MINERALS) TABS tablet   Oral   Take 1 tablet by mouth daily.         Marland Kitchen OLANZapine (ZYPREXA) 2.5 MG tablet      TAKE (1) TABLET BY MOUTH AT BEDTIME.   30 tablet   4     Dispense as written.   Marland Kitchen Phenylephrine HCl (AFRIN ALLERGY NA)   Nasal   Place 2 sprays into the nose as needed (nasal congestion).         Bertram Gala  Glycol-Propyl Glycol (SYSTANE OP)   Ophthalmic   Apply 1 drop to eye 4 (four) times daily.         . polyethylene glycol powder (GLYCOLAX/MIRALAX) powder      MIX 1 CAPFUL (17 G) IN 8 OUNCES OF JUICE/WATER AND DRINK ONCE DAILY.   527 g   4   . SENNALAX-S 8.6-50 MG per tablet      TAKE 1 TABLET BY MOUTH ONCE DAILY AS NEEDED FOR CONSTIPATION.   30 tablet   0   . white petrolatum (VASELINE) GEL   Topical   Apply 1 application topically 2 (two) times daily as needed. Apply with q-tip bilaterally to nasal septum          Triage Vitals: BP 168/83  Pulse 72  SpO2 100% Physical Exam  Nursing note and vitals reviewed. Constitutional: She appears well-developed and well-nourished. No distress.  HENT:  Head: Normocephalic and atraumatic.  Eyes: Conjunctivae and EOM are normal.  Neck: Normal range of motion. Neck supple. No rigidity. No tracheal deviation and no edema present.  Patient does not describe any discomfort with range of motion of head/neck exercises  Cardiovascular: Normal rate, regular rhythm and normal heart sounds.   Pulmonary/Chest: Effort normal and breath sounds normal. No stridor. No respiratory distress. She has no wheezes. She has no rales.  Abdominal: She exhibits no distension.  Musculoskeletal: She exhibits no edema.  Normal straight leg raises.  Neurological: She is alert. No cranial nerve deficit.  Normal, equal grip strength. Patient has appropriate/symmetric strength in all 4 extremities. Patient is disoriented, though she does answer simple questions appropriately, briefly, clearly.    Skin: Skin is warm and dry.  Psychiatric: She has a normal mood and affect. Her speech is normal.    ED Course  Procedures (including critical care time) DIAGNOSTIC STUDIES: Oxygen Saturation is 100% on RA, normal by my interpretation.  COORDINATION OF CARE: 5:52 PM- Evaluated pt and will discharge back to nursing facility.  Pt and nursing home representative  verbalizes understanding and agrees to plan.  Medications - No data to display  Labs Review Labs Reviewed - No data to display Imaging Review No results found.  EKG Interpretation   None       MDM  No diagnosis found.  I personally performed the services described in this documentation, which was scribed in my presence. The recorded information has been reviewed and is accurate.   This elderly female presents after a partial fall.  Patient is not on anticoagulant, has no new neurologic deficits, no evidence of chronic changes to the head or neck.  Given her age, dementia, and it is not indicated.  With no distress, no neurologic deficits, and her residency in a monitored facility, she was discharged in stable condition with return precautions provided to her caregiver.  Gerhard Munch, MD 09/26/13 [phone]

## 2013-09-26 NOTE — ED Notes (Signed)
Pt is a resident at [facility], pt slid down side of bed and struck back of head on floor, sent for eval, pt did not lose conciousness, pt has dementia, [employer] representative at bedside.

## 2013-09-26 NOTE — ED Notes (Signed)
[employer] here to transport pt.

## 2013-09-30 ENCOUNTER — Other Ambulatory Visit: Payer: Self-pay | Admitting: Family Medicine

## 2013-10-13 ENCOUNTER — Ambulatory Visit (INDEPENDENT_AMBULATORY_CARE_PROVIDER_SITE_OTHER): Payer: Medicare Other | Admitting: Otolaryngology

## 2013-10-13 DIAGNOSIS — H6120 Impacted cerumen, unspecified ear: Secondary | ICD-10-CM

## 2013-10-13 DIAGNOSIS — H6040 Cholesteatoma of external ear, unspecified ear: Secondary | ICD-10-CM

## 2013-10-31 ENCOUNTER — Other Ambulatory Visit: Payer: Self-pay | Admitting: Family Medicine

## 2013-11-14 ENCOUNTER — Other Ambulatory Visit: Payer: Self-pay | Admitting: Family Medicine

## 2013-12-12 ENCOUNTER — Other Ambulatory Visit: Payer: Self-pay | Admitting: Family Medicine

## 2013-12-17 ENCOUNTER — Emergency Department (HOSPITAL_COMMUNITY)
Admission: EM | Admit: 2013-12-17 | Discharge: 2013-12-17 | Disposition: A | Discharge status: Home / Self Care | Payer: Medicare Other | Attending: Emergency Medicine | Admitting: Emergency Medicine

## 2013-12-17 ENCOUNTER — Emergency Department (HOSPITAL_COMMUNITY): Payer: Medicare Other

## 2013-12-17 ENCOUNTER — Encounter (HOSPITAL_COMMUNITY): Payer: Self-pay | Admitting: Emergency Medicine

## 2013-12-17 DIAGNOSIS — R443 Hallucinations, unspecified: Secondary | ICD-10-CM | POA: Insufficient documentation

## 2013-12-17 DIAGNOSIS — Z872 Personal history of diseases of the skin and subcutaneous tissue: Secondary | ICD-10-CM | POA: Insufficient documentation

## 2013-12-17 DIAGNOSIS — H409 Unspecified glaucoma: Secondary | ICD-10-CM | POA: Insufficient documentation

## 2013-12-17 DIAGNOSIS — Z8781 Personal history of (healed) traumatic fracture: Secondary | ICD-10-CM | POA: Insufficient documentation

## 2013-12-17 DIAGNOSIS — G4700 Insomnia, unspecified: Secondary | ICD-10-CM | POA: Insufficient documentation

## 2013-12-17 DIAGNOSIS — Z7982 Long term (current) use of aspirin: Secondary | ICD-10-CM | POA: Insufficient documentation

## 2013-12-17 DIAGNOSIS — Z8669 Personal history of other diseases of the nervous system and sense organs: Secondary | ICD-10-CM | POA: Insufficient documentation

## 2013-12-17 DIAGNOSIS — F329 Major depressive disorder, single episode, unspecified: Secondary | ICD-10-CM | POA: Insufficient documentation

## 2013-12-17 DIAGNOSIS — F0390 Unspecified dementia without behavioral disturbance: Secondary | ICD-10-CM

## 2013-12-17 DIAGNOSIS — F3289 Other specified depressive episodes: Secondary | ICD-10-CM | POA: Insufficient documentation

## 2013-12-17 DIAGNOSIS — F0391 Unspecified dementia with behavioral disturbance: Secondary | ICD-10-CM | POA: Insufficient documentation

## 2013-12-17 DIAGNOSIS — F411 Generalized anxiety disorder: Secondary | ICD-10-CM | POA: Insufficient documentation

## 2013-12-17 DIAGNOSIS — M129 Arthropathy, unspecified: Secondary | ICD-10-CM | POA: Insufficient documentation

## 2013-12-17 DIAGNOSIS — I10 Essential (primary) hypertension: Secondary | ICD-10-CM | POA: Insufficient documentation

## 2013-12-17 DIAGNOSIS — Z862 Personal history of diseases of the blood and blood-forming organs and certain disorders involving the immune mechanism: Secondary | ICD-10-CM | POA: Insufficient documentation

## 2013-12-17 DIAGNOSIS — Z86718 Personal history of other venous thrombosis and embolism: Secondary | ICD-10-CM | POA: Insufficient documentation

## 2013-12-17 DIAGNOSIS — Z79899 Other long term (current) drug therapy: Secondary | ICD-10-CM | POA: Insufficient documentation

## 2013-12-17 DIAGNOSIS — F03918 Unspecified dementia, unspecified severity, with other behavioral disturbance: Secondary | ICD-10-CM | POA: Insufficient documentation

## 2013-12-17 LAB — CBC WITH DIFFERENTIAL/PLATELET
BASOS ABS: 0 10*3/uL (ref 0.0–0.1)
Basophils Relative: 0 % (ref 0–1)
EOS PCT: 1 % (ref 0–5)
Eosinophils Absolute: 0.1 10*3/uL (ref 0.0–0.7)
HEMATOCRIT: 35.6 % — AB (ref 36.0–46.0)
HEMOGLOBIN: 11.5 g/dL — AB (ref 12.0–15.0)
LYMPHS ABS: 1.2 10*3/uL (ref 0.7–4.0)
LYMPHS PCT: 15 % (ref 12–46)
MCH: 30.9 pg (ref 26.0–34.0)
MCHC: 32.3 g/dL (ref 30.0–36.0)
MCV: 95.7 fL (ref 78.0–100.0)
MONO ABS: 0.4 10*3/uL (ref 0.1–1.0)
MONOS PCT: 6 % (ref 3–12)
Neutro Abs: 6.2 10*3/uL (ref 1.7–7.7)
Neutrophils Relative %: 78 % — ABNORMAL HIGH (ref 43–77)
Platelets: 251 10*3/uL (ref 150–400)
RBC: 3.72 MIL/uL — ABNORMAL LOW (ref 3.87–5.11)
RDW: 14.7 % (ref 11.5–15.5)
WBC: 7.9 10*3/uL (ref 4.0–10.5)

## 2013-12-17 LAB — URINALYSIS, ROUTINE W REFLEX MICROSCOPIC
BILIRUBIN URINE: NEGATIVE
GLUCOSE, UA: NEGATIVE mg/dL
HGB URINE DIPSTICK: NEGATIVE
Ketones, ur: NEGATIVE mg/dL
Leukocytes, UA: NEGATIVE
Nitrite: NEGATIVE
PH: 6 (ref 5.0–8.0)
Protein, ur: NEGATIVE mg/dL
Specific Gravity, Urine: 1.025 (ref 1.005–1.030)
Urobilinogen, UA: 0.2 mg/dL (ref 0.0–1.0)

## 2013-12-17 LAB — BASIC METABOLIC PANEL
BUN: 27 mg/dL — ABNORMAL HIGH (ref 6–23)
CHLORIDE: 107 meq/L (ref 96–112)
CO2: 29 mEq/L (ref 19–32)
CREATININE: 0.91 mg/dL (ref 0.50–1.10)
Calcium: 9.3 mg/dL (ref 8.4–10.5)
GFR calc Af Amer: 58 mL/min — ABNORMAL LOW (ref >90)
GFR calc non Af Amer: 50 mL/min — ABNORMAL LOW (ref >90)
Glucose, Bld: 103 mg/dL — ABNORMAL HIGH (ref 70–99)
POTASSIUM: 4.4 meq/L (ref 3.7–5.3)
Sodium: 144 mEq/L (ref 137–147)

## 2013-12-17 LAB — TROPONIN I: Troponin I: <0.3 ng/mL (ref <0.30)

## 2013-12-17 MED ORDER — SODIUM CHLORIDE 0.9 % IV BOLUS (SEPSIS)
500.0000 mL | Freq: Once | INTRAVENOUS | Status: AC
  Administered 2013-12-17: 500 mL via INTRAVENOUS

## 2013-12-17 MED ORDER — SODIUM CHLORIDE 0.9 % IV BOLUS (SEPSIS): 500.0000 mL | Freq: Once | INTRAVENOUS | Status: DC

## 2013-12-17 NOTE — Discharge Instructions (Signed)
Followup your primary care Dr. °

## 2013-12-17 NOTE — ED Notes (Signed)
[facility] L[facility].

## 2013-12-17 NOTE — ED Notes (Signed)
Patient with no complaints at this time. Respirations even and unlabored. Skin warm/dry. Discharge instructions reviewed with patient at this time. Patient given opportunity to voice concerns/ask questions. IV removed per policy and band-aid applied to site. Foley catheter removed per policy. Patient discharged at this time and left Emergency Department [facility] wheelchair [facility].

## 2013-12-17 NOTE — ED Provider Notes (Signed)
CSN: [id number]     Arrival date & time 12/17/13  4098 History  This chart was scribed for Donnetta Hutching, MD by Quintella Reichert, ED scribe.  This patient was seen in room APA18/APA18 and the patient's care was started at 9:31 AM.   Chief Complaint  Patient presents with  . Altered Mental Status    The history is provided by the EMS personnel and the nursing home. The history is limited by the condition of the patient. No language interpreter was used.    Leel 5 Caveat: Dementia  HPI Comments: Wendy Coleman is a 80 y.o. female brought in by EMS to the Emergency Department for AMS and decreased responsiveness that began this morning.  Pt has h/o dementia and is a resident of [address]  Per EMS, pt's family reports that yesterday she was agitated and had some hallucinations which is uncharacteristic for her.  She was otherwise at her baseline mental status up until at least 5:30 AM this morning when nursing home staff saw her get up to take her medications.  At 8 AM staff found her lying on the floor in a supine position and only responsive to pain.  Nursing home staff state that she has since returned somewhat to her baseline mental status, but that she is more lethargic than usual.  At her baseline she is able to ambulate with a walker.  Staff report pt has not been complaining of pain to any area.     Past Medical History  Diagnosis Date  . Hypertension   . Dementia   . Glaucoma   . Hip fracture, left   . Cellulitis   . Anemia   . Left leg DVT   . Anxiety   . Insomnia   . Arthritis   . Chronic ear infection     Past Surgical History  Procedure Laterality Date  . Orif hip fracture  05/23/2011    Procedure: OPEN REDUCTION INTERNAL FIXATION HIP;  Surgeon: Fuller Canada, MD;  Location: [facility];  Service: Orthopedics;  Laterality: Left;  with Gamma Nail  . Hip arthrotomy    . Abdominal hysterectomy      No family history on file.   History  Substance Use Topics  .  Smoking status: Never Smoker   . Smokeless tobacco: Never Used  . Alcohol Use: No    OB History   Grav Para Term Preterm Abortions TAB SAB Ect Mult Living            0       Review of Systems  Unable to perform ROS: Dementia       Allergies  Review of patient's allergies indicates no known allergies.  Home Medications   Current Outpatient Rx  Name  Route  Sig  Dispense  Refill  . ASPIRIN LOW DOSE 81 MG EC tablet      TAKE 1 TABLET BY MOUTH ONCE DAILY.   30 tablet   6   . bimatoprost (LUMIGAN) 0.01 % SOLN   Ophthalmic   Apply 1 drop to eye at bedtime.           . citalopram (CELEXA) 10 MG tablet      TAKE (1) TABLET BY MOUTH EACH MORNING.   30 tablet   0   . Cranberry 425 MG CAPS      TAKE 1 CAPSULE BY MOUTH ONCE DAILY.   30 capsule   5   . lisinopril (PRINIVIL,ZESTRIL) 5 MG tablet  Oral   Take 5 mg by mouth daily.         Marland Kitchen. loratadine (CLARITIN) 10 MG tablet   Oral   Take 10 mg by mouth daily.         . Multiple Vitamin (MULTIVITAMIN WITH MINERALS) TABS tablet   Oral   Take 1 tablet by mouth daily.         Marland Kitchen. OLANZapine (ZYPREXA) 5 MG tablet   Oral   Take 5 mg by mouth at bedtime.         Bertram Gala. Polyethyl Glycol-Propyl Glycol (SYSTANE OP)   Ophthalmic   Apply 1 drop to eye 4 (four) times daily.         . polyethylene glycol powder (GLYCOLAX/MIRALAX) powder      MIX 1 CAPFUL (17 G) IN 8 OUNCES OF JUICE/WATER AND DRINK ONCE DAILY.   527 g   4   . STOOL SOFTENER 100 MG capsule      TAKE 1 CAPSULE BY MOUTH ONCE DAILY.   30 capsule   11   . glycerin adult (GLYCERIN ADULT) 2 G SUPP   Rectal   Place 1 suppository rectally daily as needed (for constipation).         Marland Kitchen. loperamide (IMODIUM) 2 MG capsule   Oral   Take 2 mg by mouth every 6 (six) hours as needed for diarrhea or loose stools.          Marland Kitchen. LORazepam (ATIVAN) 1 MG tablet   Oral   Take 0.5 tablets (0.5 mg total) by mouth every 8 (eight) hours as needed for anxiety.    15 tablet   0   . Q-PAP 500 MG tablet      TAKE 2 TABLETS BY MOUTH EVERY 4 HOURS AS NEEDED FOR PAIN. NO MORE THAN 2 DOSES IN 24 HOURS.   60 tablet   5   . QC NASAL RELIEF SINUS 0.05 % nasal spray      INHALE 2 SPRAYS INTO EACH NOSTRIL EVERY 6 HOURS AS NEEDED FOR NASAL IRRITATION.   30 mL   6   . SENNALAX-S 8.6-50 MG per tablet      TAKE 1 TABLET BY MOUTH ONCE DAILY AS NEEDED FOR CONSTIPATION.   30 tablet   0    BP 138/79  Pulse 61  Resp 16  Wt 121 lb (54.885 kg)  SpO2 100%  Physical Exam  Nursing note and vitals reviewed. Constitutional: She appears well-developed.  Confused, demented  HENT:  Head: Normocephalic and atraumatic.  Eyes: Conjunctivae and EOM are normal. Pupils are equal, round, and reactive to light.  Neck: Normal range of motion. Neck supple.  Cardiovascular: Normal rate, regular rhythm and normal heart sounds.   Pulmonary/Chest: Effort normal and breath sounds normal.  Abdominal: Soft. Bowel sounds are normal.  Neurological:  Unable to assess  Skin: Skin is warm and dry.  Psychiatric:  Unable to assess    ED Course  Procedures (including critical care time)  DIAGNOSTIC STUDIES: Oxygen Saturation is 100% on room air, normal by my interpretation.    COORDINATION OF CARE: 9:36 AM-Discussed treatment plan which includes IV fluids, labs and imaging with caregivers at bedside and they agreed to plan.     Results for orders placed during the hospital encounter of 12/17/13  BASIC METABOLIC PANEL      Result Value Ref Range   Sodium 144  137 - 147 mEq/L   Potassium 4.4  3.7 - 5.3 mEq/L  Chloride 107  96 - 112 mEq/L   CO2 29  19 - 32 mEq/L   Glucose, Bld 103 (*) 70 - 99 mg/dL   BUN 27 (*) 6 - 23 mg/dL   Creatinine, Ser 1.19  0.50 - 1.10 mg/dL   Calcium 9.3  8.4 - 14.7 mg/dL   GFR calc non Af Amer 50 (*) >90 mL/min   GFR calc Af Amer 58 (*) >90 mL/min  CBC WITH DIFFERENTIAL      Result Value Ref Range   WBC 7.9  4.0 - 10.5 K/uL   RBC 3.72  (*) 3.87 - 5.11 MIL/uL   Hemoglobin 11.5 (*) 12.0 - 15.0 g/dL   HCT 82.9 (*) 56.2 - 13.0 %   MCV 95.7  78.0 - 100.0 fL   MCH 30.9  26.0 - 34.0 pg   MCHC 32.3  30.0 - 36.0 g/dL   RDW 86.5  78.4 - 69.6 %   Platelets 251  150 - 400 K/uL   Neutrophils Relative % 78 (*) 43 - 77 %   Neutro Abs 6.2  1.7 - 7.7 K/uL   Lymphocytes Relative 15  12 - 46 %   Lymphs Abs 1.2  0.7 - 4.0 K/uL   Monocytes Relative 6  3 - 12 %   Monocytes Absolute 0.4  0.1 - 1.0 K/uL   Eosinophils Relative 1  0 - 5 %   Eosinophils Absolute 0.1  0.0 - 0.7 K/uL   Basophils Relative 0  0 - 1 %   Basophils Absolute 0.0  0.0 - 0.1 K/uL  TROPONIN I      Result Value Ref Range   Troponin I <0.30  <0.30 ng/mL  URINALYSIS, ROUTINE W REFLEX MICROSCOPIC      Result Value Ref Range   Color, Urine YELLOW  YELLOW   APPearance CLEAR  CLEAR   Specific Gravity, Urine 1.025  1.005 - 1.030   pH 6.0  5.0 - 8.0   Glucose, UA NEGATIVE  NEGATIVE mg/dL   Hgb urine dipstick NEGATIVE  NEGATIVE   Bilirubin Urine NEGATIVE  NEGATIVE   Ketones, ur NEGATIVE  NEGATIVE mg/dL   Protein, ur NEGATIVE  NEGATIVE mg/dL   Urobilinogen, UA 0.2  0.0 - 1.0 mg/dL   Nitrite NEGATIVE  NEGATIVE   Leukocytes, UA NEGATIVE  NEGATIVE    Dg Chest 1 View  12/17/2013   CLINICAL DATA:  AMS  EXAM: CHEST - 1 VIEW  COMPARISON:  DG CHEST 2 VIEW dated 07/23/2013  FINDINGS: The heart size and mediastinal contours are within normal limits. Calcified granuloma left lung base otherwise both lungs are clear. Osteoarthritic changes within the shoulders. .  IMPRESSION: No active disease.   Electronically Signed   By: Salome Holmes M.D.   On: 12/17/2013 10:45    Ct Head Wo Contrast  12/17/2013   CLINICAL DATA:  ALTERED MENTAL STATUS  EXAM: CT HEAD WITHOUT CONTRAST  TECHNIQUE: Contiguous axial images were obtained from the base of the skull through the vertex without intravenous contrast.  COMPARISON:  CT HEAD W/O CM dated 09/13/2013; CT C SPINE W/O CM dated 09/13/2013; CT  HEAD W/O CM dated 07/23/2013  FINDINGS: There is no evidence of mass effect, midline shift, or extra-axial fluid collections. There is no evidence of a space-occupying lesion or intracranial hemorrhage. There is no evidence of a cortical-based area of acute infarction. There is generalized cerebral atrophy. There is periventricular white matter low attenuation likely secondary to microangiopathy.  The ventricles and  sulci are appropriate for the patient's age. The basal cisterns are patent.  Visualized portions of the orbits are unremarkable. The visualized portions of the paranasal sinuses and mastoid air cells are unremarkable. Cerebrovascular atherosclerotic calcifications are noted.  The osseous structures are unremarkable.  IMPRESSION: No acute intracranial pathology.   Electronically Signed   By: Elige Ko   On: 12/17/2013 10:52      EKG Interpretation   Date/Time:  Saturday December 17 2013 09:14:11 EDT Ventricular Rate:  56 PR Interval:  158 QRS Duration: 84 QT Interval:  458 QTC Calculation: 441 R Axis:   66 Text Interpretation:  Sinus bradycardia Otherwise normal ECG When compared  with ECG of 15-Jun-2013 15:18, No significant change was found Confirmed  by Jayro Mcmath  MD, Lakeena Downie ([id number]) on 12/17/2013 10:09:12 AM      MDM   Final diagnoses:  Dementia    Caregivers report normal behavior. CT head negative. Chest x-ray and urinalysis showed no evidence of infection.    I personally performed the services described in this documentation, which was scribed in my presence. The recorded information has been reviewed and is accurate.    Donnetta Hutching, MD 12/17/13 1324

## 2013-12-17 NOTE — ED Notes (Signed)
Patient brought in via EMS on scope board with c-collar in place. [facility]. Patient lethargic, arousable to sternal rub. Patient agitated when aroused. Per EMS, [facility] staff stated patient was up at 05:30 this morning to take medications and drink water, refused to have clothes changed. Per EMS patient found this morning on floor at 08:00 in supine position, only responsive to pain. Per EMS patient's family stated that she was extremely agitated yesterday and had some hallucinations which is uncharacteristic for patient. Per EMS blood sugar 198, o2 sat 98% on room air, Sand sinus rhythm on 12 lead EKG.

## 2014-01-03 ENCOUNTER — Other Ambulatory Visit: Payer: Self-pay | Admitting: Family Medicine

## 2014-01-05 ENCOUNTER — Other Ambulatory Visit (HOSPITAL_COMMUNITY): Payer: Self-pay | Admitting: Family Medicine

## 2014-01-16 ENCOUNTER — Other Ambulatory Visit: Payer: Self-pay | Admitting: Family Medicine

## 2014-01-23 ENCOUNTER — Other Ambulatory Visit: Payer: Self-pay | Admitting: Family Medicine

## 2014-02-09 ENCOUNTER — Ambulatory Visit (INDEPENDENT_AMBULATORY_CARE_PROVIDER_SITE_OTHER): Payer: Medicare Other | Admitting: Otolaryngology

## 2014-02-09 DIAGNOSIS — H6120 Impacted cerumen, unspecified ear: Secondary | ICD-10-CM

## 2014-02-09 DIAGNOSIS — H6040 Cholesteatoma of external ear, unspecified ear: Secondary | ICD-10-CM

## 2014-02-10 ENCOUNTER — Other Ambulatory Visit: Payer: Self-pay | Admitting: Family Medicine

## 2014-02-17 ENCOUNTER — Other Ambulatory Visit: Payer: Self-pay | Admitting: Family Medicine

## 2014-03-20 ENCOUNTER — Other Ambulatory Visit: Payer: Self-pay | Admitting: Family Medicine

## 2014-04-04 ENCOUNTER — Other Ambulatory Visit: Payer: Self-pay | Admitting: Family Medicine

## 2014-04-05 ENCOUNTER — Encounter (HOSPITAL_COMMUNITY): Payer: Self-pay | Admitting: Emergency Medicine

## 2014-04-05 ENCOUNTER — Emergency Department (HOSPITAL_COMMUNITY): Payer: Medicare Other

## 2014-04-05 ENCOUNTER — Other Ambulatory Visit: Payer: Self-pay

## 2014-04-05 ENCOUNTER — Emergency Department (HOSPITAL_COMMUNITY)
Admission: EM | Admit: 2014-04-05 | Discharge: 2014-04-05 | Disposition: A | Discharge status: Home / Self Care | Payer: Medicare Other | Attending: Emergency Medicine | Admitting: Emergency Medicine

## 2014-04-05 DIAGNOSIS — F0390 Unspecified dementia without behavioral disturbance: Secondary | ICD-10-CM | POA: Insufficient documentation

## 2014-04-05 DIAGNOSIS — Z86718 Personal history of other venous thrombosis and embolism: Secondary | ICD-10-CM | POA: Insufficient documentation

## 2014-04-05 DIAGNOSIS — Z79899 Other long term (current) drug therapy: Secondary | ICD-10-CM | POA: Insufficient documentation

## 2014-04-05 DIAGNOSIS — I10 Essential (primary) hypertension: Secondary | ICD-10-CM | POA: Insufficient documentation

## 2014-04-05 DIAGNOSIS — H65499 Other chronic nonsuppurative otitis media, unspecified ear: Secondary | ICD-10-CM | POA: Insufficient documentation

## 2014-04-05 DIAGNOSIS — L519 Erythema multiforme, unspecified: Secondary | ICD-10-CM | POA: Insufficient documentation

## 2014-04-05 DIAGNOSIS — G4700 Insomnia, unspecified: Secondary | ICD-10-CM | POA: Insufficient documentation

## 2014-04-05 DIAGNOSIS — D649 Anemia, unspecified: Secondary | ICD-10-CM | POA: Insufficient documentation

## 2014-04-05 DIAGNOSIS — H40009 Preglaucoma, unspecified, unspecified eye: Secondary | ICD-10-CM | POA: Insufficient documentation

## 2014-04-05 DIAGNOSIS — L0390 Cellulitis, unspecified: Secondary | ICD-10-CM

## 2014-04-05 DIAGNOSIS — Z7982 Long term (current) use of aspirin: Secondary | ICD-10-CM | POA: Insufficient documentation

## 2014-04-05 DIAGNOSIS — M129 Arthropathy, unspecified: Secondary | ICD-10-CM | POA: Insufficient documentation

## 2014-04-05 DIAGNOSIS — F411 Generalized anxiety disorder: Secondary | ICD-10-CM | POA: Insufficient documentation

## 2014-04-05 DIAGNOSIS — L0291 Cutaneous abscess, unspecified: Secondary | ICD-10-CM | POA: Insufficient documentation

## 2014-04-05 LAB — CBC WITH DIFFERENTIAL/PLATELET
BASOS ABS: 0 10*3/uL (ref 0.0–0.1)
Basophils Relative: 0 % (ref 0–1)
Eosinophils Absolute: 0.1 10*3/uL (ref 0.0–0.7)
Eosinophils Relative: 2 % (ref 0–5)
HEMATOCRIT: 30.5 % — AB (ref 36.0–46.0)
HEMOGLOBIN: 10 g/dL — AB (ref 12.0–15.0)
Lymphocytes Relative: 28 % (ref 12–46)
Lymphs Abs: 1.9 10*3/uL (ref 0.7–4.0)
MCH: 31.5 pg (ref 26.0–34.0)
MCHC: 32.8 g/dL (ref 30.0–36.0)
MCV: 96.2 fL (ref 78.0–100.0)
MONO ABS: 0.5 10*3/uL (ref 0.1–1.0)
MONOS PCT: 8 % (ref 3–12)
NEUTROS ABS: 4.1 10*3/uL (ref 1.7–7.7)
Neutrophils Relative %: 62 % (ref 43–77)
Platelets: 253 10*3/uL (ref 150–400)
RBC: 3.17 MIL/uL — ABNORMAL LOW (ref 3.87–5.11)
RDW: 14.4 % (ref 11.5–15.5)
WBC: 6.7 10*3/uL (ref 4.0–10.5)

## 2014-04-05 LAB — URINALYSIS, ROUTINE W REFLEX MICROSCOPIC
BILIRUBIN URINE: NEGATIVE
GLUCOSE, UA: NEGATIVE mg/dL
HGB URINE DIPSTICK: NEGATIVE
Ketones, ur: NEGATIVE mg/dL
Nitrite: NEGATIVE
PROTEIN: NEGATIVE mg/dL
Specific Gravity, Urine: 1.01 (ref 1.005–1.030)
Urobilinogen, UA: 0.2 mg/dL (ref 0.0–1.0)
pH: 5.5 (ref 5.0–8.0)

## 2014-04-05 LAB — COMPREHENSIVE METABOLIC PANEL
ALT: 9 U/L (ref 0–35)
AST: 17 U/L (ref 0–37)
Albumin: 3 g/dL — ABNORMAL LOW (ref 3.5–5.2)
Alkaline Phosphatase: 78 U/L (ref 39–117)
Anion gap: 9 (ref 5–15)
BILIRUBIN TOTAL: 0.2 mg/dL — AB (ref 0.3–1.2)
BUN: 29 mg/dL — ABNORMAL HIGH (ref 6–23)
CHLORIDE: 107 meq/L (ref 96–112)
CO2: 26 mEq/L (ref 19–32)
CREATININE: 1.08 mg/dL (ref 0.50–1.10)
Calcium: 9.1 mg/dL (ref 8.4–10.5)
GFR calc Af Amer: 47 mL/min — ABNORMAL LOW (ref >90)
GFR, EST NON AFRICAN AMERICAN: 41 mL/min — AB (ref >90)
Glucose, Bld: 92 mg/dL (ref 70–99)
Potassium: 4.4 mEq/L (ref 3.7–5.3)
Sodium: 142 mEq/L (ref 137–147)
Total Protein: 7.9 g/dL (ref 6.0–8.3)

## 2014-04-05 LAB — URINE MICROSCOPIC-ADD ON

## 2014-04-05 LAB — I-STAT TROPONIN, ED: Troponin i, poc: 0 ng/mL (ref 0.00–0.08)

## 2014-04-05 NOTE — ED Notes (Signed)
Pt is from [facility]  Pt c/o pain all over

## 2014-04-05 NOTE — ED Provider Notes (Signed)
CSN: [id number]     Arrival date & time 04/05/14  0223 History   First MD Initiated Contact with Patient 04/05/14 0225     Chief Complaint  Patient presents with  . Muscle Pain     (Consider location/radiation/quality/duration/timing/severity/associated sxs/prior Treatment) HPI Patient with a history of dementia presents by EMS from nursing facility. She is unable to tell say why she came to the emergency department. She is oriented only to self. She is making vague complaints of pain without specificity. Time signs were stable en route. Past Medical History  Diagnosis Date  . Hypertension   . Dementia   . Glaucoma   . Hip fracture, left   . Cellulitis   . Anemia   . Left leg DVT   . Anxiety   . Insomnia   . Arthritis   . Chronic ear infection    Past Surgical History  Procedure Laterality Date  . Orif hip fracture  05/23/2011    Procedure: OPEN REDUCTION INTERNAL FIXATION HIP;  Surgeon: Fuller CanadaStanley Harrison, MD;  Location: [facility];  Service: Orthopedics;  Laterality: Left;  with Gamma Nail  . Hip arthrotomy    . Abdominal hysterectomy     No family history on file. History  Substance Use Topics  . Smoking status: Never Smoker   . Smokeless tobacco: Never Used  . Alcohol Use: No   OB History   Grav Para Term Preterm Abortions TAB SAB Ect Mult Living            0     Review of Systems  Unable to perform ROS: Dementia      Allergies  Review of patient's allergies indicates no known allergies.  Home Medications   Prior to Admission medications   Medication Sig Start Date End Date Taking? Authorizing Provider  ASPIRIN LOW DOSE 81 MG EC tablet TAKE 1 TABLET BY MOUTH ONCE DAILY. 09/30/13   Babs SciaraScott A Luking, MD  citalopram (CELEXA) 10 MG tablet TAKE (1) TABLET BY MOUTH EACH MORNING.    Merlyn AlbertWilliam S Luking, MD  Cranberry 425 MG CAPS TAKE 1 CAPSULE BY MOUTH ONCE DAILY. 01/05/14   Babs SciaraScott A Luking, MD  GLYCERIN ADULT 2 G suppository INSERT 1 SUPPOSITORY RECTALLY ONCE DAILY AS NEEDED  FOR BOWEL MOVEMENT. 04/04/14   Merlyn AlbertWilliam S Luking, MD  lisinopril (PRINIVIL,ZESTRIL) 5 MG tablet TAKE 1 TABLET BY MOUTH EVERY MORNING. 01/03/14   Merlyn AlbertWilliam S Luking, MD  loperamide (IMODIUM) 2 MG capsule Take 2 mg by mouth every 6 (six) hours as needed for diarrhea or loose stools.  06/14/13   Historical Provider, MD  loratadine (CLARITIN) 10 MG tablet Take 10 mg by mouth daily.    Historical Provider, MD  LORazepam (ATIVAN) 1 MG tablet Take 0.5 tablets (0.5 mg total) by mouth every 8 (eight) hours as needed for anxiety. 07/23/13   Benny LennertJoseph L Zammit, MD  LUMIGAN 0.01 % SOLN INSTILL [facility]. 02/17/14   Babs SciaraScott A Luking, MD  Multiple Vitamin (DAILY VITE) TABS TAKE 1 TABLET BY MOUTH ONCE DAILY. 01/23/14   Merlyn AlbertWilliam S Luking, MD  OLANZapine (ZYPREXA) 5 MG tablet Take 5 mg by mouth at bedtime.    Historical Provider, MD  Polyethyl Glycol-Propyl Glycol (SYSTANE OP) Apply 1 drop to eye 4 (four) times daily.    Historical Provider, MD  polyethylene glycol powder (GLYCOLAX/MIRALAX) powder MIX 1 CAPFUL (17 G) IN 8 OUNCES OF JUICE/WATER AND DRINK ONCE DAILY. 06/22/13   Merlyn AlbertWilliam S Luking, MD  Q-PAP 500 MG tablet TAKE 2 TABLETS BY MOUTH EVERY 4 HOURS AS NEEDED FOR PAIN. NO MORE THAN 2 DOSES IN 24 HOURS. 11/14/13   Merlyn AlbertWilliam S Luking, MD  QC NASAL RELIEF SINUS 0.05 % nasal spray INHALE 2 SPRAYS INTO EACH NOSTRIL EVERY 6 HOURS AS NEEDED FOR NASAL IRRITATION. 09/30/13   Babs SciaraScott A Luking, MD  SENNALAX-S 8.6-50 MG per tablet TAKE 1 TABLET BY MOUTH ONCE DAILY AS NEEDED FOR CONSTIPATION. 09/26/13   Merlyn AlbertWilliam S Luking, MD  STOOL SOFTENER 100 MG capsule TAKE 1 CAPSULE BY MOUTH ONCE DAILY. 10/31/13   Merlyn AlbertWilliam S Luking, MD   There were no vitals taken for this visit. Physical Exam  Nursing note and vitals reviewed. Constitutional: She is oriented to person, place, and time. She appears well-developed and well-nourished. No distress.  HENT:  Head: Normocephalic and atraumatic.  Mouth/Throat: Oropharynx is clear and moist.   Eyes: EOM are normal. Pupils are equal, round, and reactive to light.  Neck: Normal range of motion. Neck supple.  Cardiovascular: Normal rate and regular rhythm.   Pulmonary/Chest: Effort normal and breath sounds normal. No respiratory distress. She has no wheezes. She has no rales. She exhibits no tenderness.  Abdominal: Soft. Bowel sounds are normal. She exhibits no distension and no mass. There is no tenderness. There is no rebound and no guarding.  Musculoskeletal: Normal range of motion. She exhibits no edema and no tenderness.  Right lower extremity erythema and swelling especially at the site of the foot. Minimal warmth. No tenderness to palpation. Distal pulses intact.  Neurological: She is alert and oriented to person, place, and time.  Oriented only to self. Moves all extremities without deficit.  Skin: Skin is warm and dry. No rash noted. There is erythema.  Psychiatric: She has a normal mood and affect. Her behavior is normal.    ED Course  Procedures (including critical care time) Labs Review Labs Reviewed - No data to display  Imaging Review No results found.   EKG Interpretation None      MDM   Final diagnoses:  None      Workup is essentially negative. Questional urinary tract infection though heavy contamination. Can followup with primary Dr. for culture results. Patient's vital signs have remained stable throughout. Her exam is nonfocal. Will discharge back to nursing home. Return precautions given.  Loren Raceravid Jeremih Dearmas, MD 04/05/14 [phone]

## 2014-04-05 NOTE — ED Notes (Signed)
Spoke with Samuella CotaSicila  from [facility] states they will be here to pick up patient around/before 0900.

## 2014-04-05 NOTE — Discharge Instructions (Signed)
Dementia Dementia is a general term for problems with brain function. A person with dementia has memory loss and a hard time with at least one other brain function such as thinking, speaking, or problem solving. Dementia can affect social functioning, how you do your job, your mood, or your personality. The changes may be hidden for a long time. The earliest forms of this disease are usually not detected by family or friends. Dementia can be:  Irreversible.  Potentially reversible.  Partially reversible.  Progressive. This means it can get worse over time. CAUSES  Irreversible dementia causes may include:  Degeneration of brain cells (Alzheimer's disease or lewy body dementia).  Multiple small strokes (vascular dementia).  Infection (chronic meningitis or Creutzfelt-Jakob disease).  Frontotemporal dementia. This affects younger people, age 40 to 70, compared to those who have Alzheimer's disease.  Dementia associated with other disorders like Parkinson's disease, Huntington's disease, or HIV-associated dementia. Potentially or partially reversible dementia causes may include:  Medicines.  Metabolic causes such as excessive alcohol intake, vitamin B12 deficiency, or thyroid disease.  Masses or pressure in the brain such as a tumor, blood clot, or hydrocephalus. SYMPTOMS  Symptoms are often hard to detect. Family members or coworkers may not notice them early in the disease process. Different people with dementia may have different symptoms. Symptoms can include:  A hard time with memory, especially recent memory. Long-term memory may not be impaired.  Asking the same question multiple times or forgetting something someone just said.  A hard time speaking your thoughts or finding certain words.  A hard time solving problems or performing familiar tasks (such as how to use a telephone).  Sudden changes in mood.  Changes in personality, especially increasing moodiness or  mistrust.  Depression.  A hard time understanding complex ideas that were never a problem in the past. DIAGNOSIS  There are no specific tests for dementia.   Your caregiver may recommend a thorough evaluation. This is because some forms of dementia can be reversible. The evaluation will likely include a physical exam and getting a detailed history from you and a family member. The history often gives the best clues and suggestions for a diagnosis.  Memory testing may be done. A detailed brain function evaluation called neuropsychologic testing may be helpful.  Lab tests and brain imaging (such as a CT scan or MRI scan) are sometimes important.  Sometimes observation and re-evaluation over time is very helpful. TREATMENT  Treatment depends on the cause.   If the problem is a vitamin deficiency, it may be helped or cured with supplements.  For dementias such as Alzheimer's disease, medicines are available to stabilize or slow the course of the disease. There are no cures for this type of dementia.  Your caregiver can help direct you to groups, organizations, and other caregivers to help with decisions in the care of you or your loved one. HOME CARE INSTRUCTIONS The care of individuals with dementia is varied and dependent upon the progression of the dementia. The following suggestions are intended for the person living with, or caring for, the person with dementia.  Create a safe environment.  Remove the locks on bathroom doors to prevent the person from accidentally locking himself or herself in.  Use childproof latches on kitchen cabinets and any place where cleaning supplies, chemicals, or alcohol are kept.  Use childproof covers in unused electrical outlets.  Install childproof devices to keep doors and windows secured.  Remove stove knobs or install safety   knobs and an automatic shut-off on the stove.  Lower the temperature on water heaters.  Label medicines and keep them  locked up.  Secure knives, lighters, matches, power tools, and guns, and keep these items out of reach.  Keep the house free from clutter. Remove rugs or anything that might contribute to a fall.  Remove objects that might break and hurt the person.  Make sure lighting is good, both inside and outside.  Install grab rails as needed.  Use a monitoring device to alert you to falls or other needs for help.  Reduce confusion.  Keep familiar objects and people around.  Use night lights or dim lights at night.  Label items or areas.  Use reminders, notes, or directions for daily activities or tasks.  Keep a simple, consistent routine for waking, meals, bathing, dressing, and bedtime.  Create a calm, quiet environment.  Place large clocks and calendars prominently.  Display emergency numbers and home address near all telephones.  Use cues to establish different times of the day. An example is to open curtains to let the natural light in during the day.   Use effective communication.  Choose simple words and short sentences.  Use a gentle, calm tone of voice.  Be careful not to interrupt.  If the person is struggling to find a word or communicate a thought, try to provide the word or thought.  Ask one question at a time. Allow the person ample time to answer questions. Repeat the question again if the person does not respond.  Reduce nighttime restlessness.  Provide a comfortable bed.  Have a consistent nighttime routine.  Ensure a regular walking or physical activity schedule. Involve the person in daily activities as much as possible.  Limit napping during the day.  Limit caffeine.  Attend social events that stimulate rather than overwhelm the senses.  Encourage good nutrition and hydration.  Reduce distractions during meal times and snacks.  Avoid foods that are too hot or too cold.  Monitor chewing and swallowing ability.  Continue with routine vision,  hearing, dental, and medical screenings.  Only give over-the-counter or prescription medicines as directed by the caregiver.  Monitor driving abilities. Do not allow the person to drive when safe driving is no longer possible.  Register with an identification program which could provide location assistance in the event of a missing person situation. SEEK MEDICAL CARE IF:   New behavioral problems start such as moodiness, aggressiveness, or seeing things that are not there (hallucinations).  Any new problem with brain function happens. This includes problems with balance, speech, or falling a lot.  Problems with swallowing develop.  Any symptoms of other illness happen. Small changes or worsening in any aspect of brain function can be a sign that the illness is getting worse. It can also be a sign of another medical illness such as infection. Seeing a caregiver right away is important. SEEK IMMEDIATE MEDICAL CARE IF:   A fever develops.  New or worsened confusion develops.  New or worsened sleepiness develops.  Staying awake becomes hard to do. Document Released: 03/11/2001 Document Revised: 12/08/2011 Document Reviewed: 02/10/2011 ExitCare Patient Information 2015 [employer]. This information is not intended to replace advice given to you by your health care provider. Make sure you discuss any questions you have with your health care provider.  

## 2014-04-11 ENCOUNTER — Other Ambulatory Visit: Payer: Self-pay | Admitting: Family Medicine

## 2014-05-29 ENCOUNTER — Other Ambulatory Visit: Payer: Self-pay | Admitting: Family Medicine

## 2014-06-08 ENCOUNTER — Ambulatory Visit (INDEPENDENT_AMBULATORY_CARE_PROVIDER_SITE_OTHER): Payer: Medicare Other | Admitting: Otolaryngology

## 2014-06-14 ENCOUNTER — Emergency Department (HOSPITAL_COMMUNITY)
Admission: EM | Admit: 2014-06-14 | Discharge: 2014-06-14 | Disposition: A | Discharge status: Home / Self Care | Payer: Medicare Other | Attending: Emergency Medicine | Admitting: Emergency Medicine

## 2014-06-14 ENCOUNTER — Encounter (HOSPITAL_COMMUNITY): Payer: Self-pay | Admitting: Emergency Medicine

## 2014-06-14 ENCOUNTER — Emergency Department (HOSPITAL_COMMUNITY): Payer: Medicare Other

## 2014-06-14 DIAGNOSIS — S6000XA Contusion of unspecified finger without damage to nail, initial encounter: Secondary | ICD-10-CM | POA: Diagnosis not present

## 2014-06-14 DIAGNOSIS — M129 Arthropathy, unspecified: Secondary | ICD-10-CM | POA: Diagnosis not present

## 2014-06-14 DIAGNOSIS — S0081XA Abrasion of other part of head, initial encounter: Secondary | ICD-10-CM

## 2014-06-14 DIAGNOSIS — Y9389 Activity, other specified: Secondary | ICD-10-CM | POA: Insufficient documentation

## 2014-06-14 DIAGNOSIS — Z8781 Personal history of (healed) traumatic fracture: Secondary | ICD-10-CM | POA: Insufficient documentation

## 2014-06-14 DIAGNOSIS — Z87828 Personal history of other (healed) physical injury and trauma: Secondary | ICD-10-CM | POA: Diagnosis not present

## 2014-06-14 DIAGNOSIS — F0390 Unspecified dementia without behavioral disturbance: Secondary | ICD-10-CM | POA: Diagnosis not present

## 2014-06-14 DIAGNOSIS — S1093XA Contusion of unspecified part of neck, initial encounter: Principal | ICD-10-CM

## 2014-06-14 DIAGNOSIS — S199XXA Unspecified injury of neck, initial encounter: Secondary | ICD-10-CM

## 2014-06-14 DIAGNOSIS — F411 Generalized anxiety disorder: Secondary | ICD-10-CM | POA: Diagnosis not present

## 2014-06-14 DIAGNOSIS — Z872 Personal history of diseases of the skin and subcutaneous tissue: Secondary | ICD-10-CM | POA: Diagnosis not present

## 2014-06-14 DIAGNOSIS — S0003XA Contusion of scalp, initial encounter: Secondary | ICD-10-CM | POA: Diagnosis not present

## 2014-06-14 DIAGNOSIS — S0993XA Unspecified injury of face, initial encounter: Secondary | ICD-10-CM | POA: Diagnosis present

## 2014-06-14 DIAGNOSIS — Z862 Personal history of diseases of the blood and blood-forming organs and certain disorders involving the immune mechanism: Secondary | ICD-10-CM | POA: Diagnosis not present

## 2014-06-14 DIAGNOSIS — Y9210 Unspecified residential institution as the place of occurrence of the external cause: Secondary | ICD-10-CM | POA: Insufficient documentation

## 2014-06-14 DIAGNOSIS — Z79899 Other long term (current) drug therapy: Secondary | ICD-10-CM | POA: Insufficient documentation

## 2014-06-14 DIAGNOSIS — W19XXXA Unspecified fall, initial encounter: Secondary | ICD-10-CM

## 2014-06-14 DIAGNOSIS — G4700 Insomnia, unspecified: Secondary | ICD-10-CM | POA: Insufficient documentation

## 2014-06-14 DIAGNOSIS — I10 Essential (primary) hypertension: Secondary | ICD-10-CM | POA: Insufficient documentation

## 2014-06-14 DIAGNOSIS — H409 Unspecified glaucoma: Secondary | ICD-10-CM | POA: Diagnosis not present

## 2014-06-14 DIAGNOSIS — Y92129 Unspecified place in nursing home as the place of occurrence of the external cause: Secondary | ICD-10-CM

## 2014-06-14 DIAGNOSIS — Z86718 Personal history of other venous thrombosis and embolism: Secondary | ICD-10-CM | POA: Insufficient documentation

## 2014-06-14 DIAGNOSIS — R296 Repeated falls: Secondary | ICD-10-CM | POA: Insufficient documentation

## 2014-06-14 DIAGNOSIS — Z7982 Long term (current) use of aspirin: Secondary | ICD-10-CM | POA: Diagnosis not present

## 2014-06-14 DIAGNOSIS — S0083XA Contusion of other part of head, initial encounter: Secondary | ICD-10-CM | POA: Insufficient documentation

## 2014-06-14 HISTORY — DX: Other injury of unspecified body region, initial encounter: T14.8XXA

## 2014-06-14 NOTE — ED Notes (Signed)
[employer] contacted for patient Ride.

## 2014-06-14 NOTE — ED Notes (Signed)
Patient and patients care giver verbalize understanding of discharge instructions and follow up care. Patient out of department at this time escorted by care giver from [facility] and [place].

## 2014-06-14 NOTE — ED Provider Notes (Signed)
CSN: [id number]     Arrival date & time 06/14/14  0131 History   First MD Initiated Contact with Patient 06/14/14 0149     Chief Complaint  Patient presents with  . Fall      Patient is a 80 y.o. female presenting with fall. The history is provided by a relative, the EMS personnel, the nursing home and the patient. The history is limited by the condition of the patient (Hx dementia).  Fall  Pt was seen at 0210. Per EMS, NH report, family and pt: pt s/p fall to the left side tonight PTA. Fall was not witnessed. Pt has significant hx of dementia and denies any complaints. Family states pt's Td is UTD.     Past Medical History  Diagnosis Date  . Hypertension   . Dementia   . Glaucoma   . Hip fracture, left   . Cellulitis   . Anemia   . Left leg DVT   . Anxiety   . Insomnia   . Arthritis   . Chronic ear infection   . 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 Orif hip fracture  05/23/2011    Procedure: OPEN REDUCTION INTERNAL FIXATION HIP;  Surgeon: Fuller Canada, MD;  Location: [facility];  Service: Orthopedics;  Laterality: Left;  with Gamma Nail  . Hip arthrotomy    . Abdominal hysterectomy      History  Substance Use Topics  . Smoking status: Never Smoker   . Smokeless tobacco: Never Used  . Alcohol Use: No    Review of Systems  Unable to perform ROS: Dementia      Allergies  Review of patient's allergies indicates no known allergies.  Home Medications   Prior to Admission medications   Medication Sig Start Date End Date Taking? Authorizing Provider  ASPIRIN LOW DOSE 81 MG EC tablet TAKE 1 TABLET BY MOUTH ONCE DAILY. 09/30/13  Yes Babs Sciara, MD  citalopram (CELEXA) 10 MG tablet TAKE (1) TABLET BY MOUTH EACH MORNING.   Yes Merlyn Albert, MD  Cranberry 425 MG CAPS TAKE 1 CAPSULE BY MOUTH ONCE DAILY. 01/05/14  Yes Babs Sciara, MD  GLYCERIN ADULT 2 G suppository INSERT 1 SUPPOSITORY  RECTALLY ONCE DAILY AS NEEDED FOR BOWEL MOVEMENT. 04/04/14  Yes Merlyn Albert, MD  lisinopril (PRINIVIL,ZESTRIL) 5 MG tablet TAKE 1 TABLET BY MOUTH EVERY MORNING. 01/03/14  Yes Merlyn Albert, MD  loperamide (IMODIUM) 2 MG capsule Take 2 mg by mouth every 6 (six) hours as needed for diarrhea or loose stools.  06/14/13  Yes Historical Provider, MD  loratadine (CLARITIN) 10 MG tablet Take 10 mg by mouth daily.   Yes Historical Provider, MD  LORazepam (ATIVAN) 1 MG tablet Take 0.5 tablets (0.5 mg total) by mouth every 8 (eight) hours as needed for anxiety. 07/23/13  Yes Benny Lennert, MD  LUMIGAN 0.01 % SOLN INSTILL 1 DROP INTO [facility] EYE AT BEDTIME. 02/17/14  Yes Babs Sciara, MD  Multiple Vitamin (DAILY VITE) TABS TAKE 1 TABLET BY MOUTH ONCE DAILY. 05/29/14  Yes Merlyn Albert, MD  OLANZapine (ZYPREXA) 5 MG tablet Take 5 mg by mouth at bedtime.   Yes Historical Provider, MD  Polyethyl Glycol-Propyl Glycol (SYSTANE OP) Apply 1 drop to eye 4 (four) times daily.   Yes Historical Provider, MD  polyethylene glycol powder (GLYCOLAX/MIRALAX) powder MIX 1 CAPFUL (17 G) IN 8 OUNCES  OF JUICE/WATER AND DRINK ONCE DAILY. 06/22/13  Yes Merlyn Albert, MD  Q-PAP 500 MG tablet TAKE 2 TABLETS BY MOUTH EVERY 4 HOURS AS NEEDED FOR PAIN. NO MORE THAN 2 DOSES IN 24 HOURS. 11/14/13  Yes Merlyn Albert, MD  QC NASAL RELIEF SINUS 0.05 % nasal spray INHALE 2 SPRAYS INTO EACH NOSTRIL EVERY 6 HOURS AS NEEDED FOR NASAL IRRITATION. 09/30/13  Yes Babs Sciara, MD  SENNALAX-S 8.6-50 MG per tablet TAKE 1 TABLET BY MOUTH ONCE DAILY AS NEEDED FOR CONSTIPATION. 04/11/14  Yes Merlyn Albert, MD  STOOL SOFTENER 100 MG capsule TAKE 1 CAPSULE BY MOUTH ONCE DAILY. 10/31/13  Yes Merlyn Albert, MD   BP 181/92  Pulse 65  Temp(Src) 97.3 F (36.3 C) (Oral)  Resp 18  Wt 130 lb (58.968 kg)  SpO2 99% Physical Exam 0215: Physical examination: Vital signs and O2 SAT: Reviewed; Constitutional: Well developed, Well nourished, Well  hydrated, In no acute distress; Head and Face: Normocephalic, No scalp hematomas, no lacs.  Non-tender to palp superior and inferior orbital rim areas.  +left zygoma tenderness with overlying hematoma and small superficial hemostatic abrasion.  No mandibular tenderness.; Eyes: EOMI, PERRL, No scleral icterus; ENMT: Mouth and pharynx normal, Left TM normal, Right TM normal, Mucous membranes moist, +teeth and tongue intact.  No intraoral or intranasal bleeding.  No septal hematomas.  No trismus, no malocclusion.; Neck: Supple, Trachea midline; Spine: No midline CS, TS, LS tenderness.; Cardiovascular: Regular rate and rhythm, No gallop; Respiratory: Breath sounds clear & equal bilaterally, No rales, rhonchi, wheezes, Normal respiratory effort/excursion; Chest: Nontender, No deformity, Movement normal, No crepitus, No abrasions or ecchymosis.; Abdomen: Soft, Nontender, Nondistended, Normal bowel sounds, No abrasions or ecchymosis.; Genitourinary: No CVA tenderness;; Extremities: No deformity, Full range of motion major/large joints of bilat UE's and LE's without pain or tenderness to palp, Neurovascularly intact, Pulses normal, +mild left ring finger tenderness to palp with localized ecchymosis, no deformity, no erythema, no open wounds, no edema. Pelvis stable; Neuro: Awake, alert, confused per hx dementia. Major CN grossly intact. Speech clear. Moves all extremities on stretcher and to command without apparent gross focal motor deficits.; Skin: Color normal, Warm, Dry.    ED Course  Procedures     MDM  MDM Reviewed: previous chart, nursing note and vitals Reviewed previous: CT scan Interpretation: CT scan and x-ray     Ct Head Wo Contrast 06/14/2014   CLINICAL DATA:  Status post fall. Abrasion at the left side of the face. Concern for head or cervical spine injury.  EXAM: CT HEAD WITHOUT CONTRAST  CT MAXILLOFACIAL WITHOUT CONTRAST  CT CERVICAL SPINE WITHOUT CONTRAST  TECHNIQUE: Multidetector CT  imaging of the head, cervical spine, and maxillofacial structures were performed using the standard protocol without intravenous contrast. Multiplanar CT image reconstructions of the cervical spine and maxillofacial structures were also generated.  COMPARISON:  CT of the cervical spine performed 09/13/2013, and CT of the head performed 12/17/2013  FINDINGS: CT HEAD FINDINGS  There is no evidence of acute infarction, mass lesion, or intra- or extra-axial hemorrhage on CT.  Prominence of the ventricles and sulci suggests mild to moderate cortical volume loss. Mild periventricular white matter change likely reflects small vessel ischemic microangiopathy. Cerebellar atrophy is noted.  The brainstem and fourth ventricle are within normal limits. The basal ganglia are unremarkable in appearance. The cerebral hemispheres demonstrate grossly normal gray-white differentiation. No mass effect or midline shift is seen.  There is no evidence  of fracture; visualized osseous structures are unremarkable in appearance. The visualized portions of the orbits are within normal limits. The paranasal sinuses and mastoid air cells are well-aerated. No significant soft tissue abnormalities are seen.  CT MAXILLOFACIAL FINDINGS  There is no evidence of fracture or dislocation. The maxilla and mandible appear intact. The nasal bone is unremarkable in appearance. The visualized dentition demonstrates no acute abnormality.  There appears to be anterior subluxation of the left temporomandibular joint, and mild degenerative change is noted at the right temporomandibular joint.  The orbits are intact bilaterally. The visualized paranasal sinuses and mastoid air cells are well-aerated.  Soft tissue swelling is noted overlying the left zygomatic arch. The parapharyngeal fat planes are preserved. The nasopharynx, oropharynx and hypopharynx are unremarkable in appearance. The visualized portions of the valleculae and piriform sinuses are grossly  unremarkable.  The parotid and submandibular glands are within normal limits. No cervical lymphadenopathy is seen.  CT CERVICAL SPINE FINDINGS  There is no evidence of acute fracture or subluxation. There is minimal grade 1 retrolisthesis of C5 on C6, and grade 1 anterolisthesis of C7 on T1. Underlying multilevel disc space narrowing is noted, with scattered anterior and posterior disc osteophyte complexes. Underlying facet disease is seen along the cervical spine. Prevertebral soft tissues are within normal limits.  The dens is difficult to fully assess due to motion artifact, but appears grossly intact on axial and coronal images.  The visualized portions of the thyroid gland are unremarkable in appearance. No significant soft tissue abnormalities are seen.  IMPRESSION: 1. No evidence of traumatic intracranial injury or fracture. 2. No evidence of fracture or dislocation with regard to the maxillofacial structures. 3. No evidence of acute fracture or subluxation along the cervical spine. 4. Soft tissue swelling noted overlying the left zygomatic arch. 5. Mild to moderate cortical volume loss and scattered small vessel ischemic microangiopathy. 6. Apparent anterior subluxation of the left temporomandibular joint, and mild degenerative change at the right temporomandibular joint. Would correlate for associated symptoms. 7. Degenerative change noted along the cervical spine.   Electronically Signed   By: Roanna Raider M.D.   On: 06/14/2014 03:30   Ct Cervical Spine Wo Contrast 06/14/2014   CLINICAL DATA:  Status post fall. Abrasion at the left side of the face. Concern for head or cervical spine injury.  EXAM: CT HEAD WITHOUT CONTRAST  CT MAXILLOFACIAL WITHOUT CONTRAST  CT CERVICAL SPINE WITHOUT CONTRAST  TECHNIQUE: Multidetector CT imaging of the head, cervical spine, and maxillofacial structures were performed using the standard protocol without intravenous contrast. Multiplanar CT image reconstructions of the  cervical spine and maxillofacial structures were also generated.  COMPARISON:  CT of the cervical spine performed 09/13/2013, and CT of the head performed 12/17/2013  FINDINGS: CT HEAD FINDINGS  There is no evidence of acute infarction, mass lesion, or intra- or extra-axial hemorrhage on CT.  Prominence of the ventricles and sulci suggests mild to moderate cortical volume loss. Mild periventricular white matter change likely reflects small vessel ischemic microangiopathy. Cerebellar atrophy is noted.  The brainstem and fourth ventricle are within normal limits. The basal ganglia are unremarkable in appearance. The cerebral hemispheres demonstrate grossly normal gray-white differentiation. No mass effect or midline shift is seen.  There is no evidence of fracture; visualized osseous structures are unremarkable in appearance. The visualized portions of the orbits are within normal limits. The paranasal sinuses and mastoid air cells are well-aerated. No significant soft tissue abnormalities are seen.  CT MAXILLOFACIAL  FINDINGS  There is no evidence of fracture or dislocation. The maxilla and mandible appear intact. The nasal bone is unremarkable in appearance. The visualized dentition demonstrates no acute abnormality.  There appears to be anterior subluxation of the left temporomandibular joint, and mild degenerative change is noted at the right temporomandibular joint.  The orbits are intact bilaterally. The visualized paranasal sinuses and mastoid air cells are well-aerated.  Soft tissue swelling is noted overlying the left zygomatic arch. The parapharyngeal fat planes are preserved. The nasopharynx, oropharynx and hypopharynx are unremarkable in appearance. The visualized portions of the valleculae and piriform sinuses are grossly unremarkable.  The parotid and submandibular glands are within normal limits. No cervical lymphadenopathy is seen.  CT CERVICAL SPINE FINDINGS  There is no evidence of acute fracture or  subluxation. There is minimal grade 1 retrolisthesis of C5 on C6, and grade 1 anterolisthesis of C7 on T1. Underlying multilevel disc space narrowing is noted, with scattered anterior and posterior disc osteophyte complexes. Underlying facet disease is seen along the cervical spine. Prevertebral soft tissues are within normal limits.  The dens is difficult to fully assess due to motion artifact, but appears grossly intact on axial and coronal images.  The visualized portions of the thyroid gland are unremarkable in appearance. No significant soft tissue abnormalities are seen.  IMPRESSION: 1. No evidence of traumatic intracranial injury or fracture. 2. No evidence of fracture or dislocation with regard to the maxillofacial structures. 3. No evidence of acute fracture or subluxation along the cervical spine. 4. Soft tissue swelling noted overlying the left zygomatic arch. 5. Mild to moderate cortical volume loss and scattered small vessel ischemic microangiopathy. 6. Apparent anterior subluxation of the left temporomandibular joint, and mild degenerative change at the right temporomandibular joint. Would correlate for associated symptoms. 7. Degenerative change noted along the cervical spine.   Electronically Signed   By: Roanna Raider M.D.   On: 06/14/2014 03:30   Dg Hand Complete Left 06/14/2014   CLINICAL DATA:  Fall, LEFT ring finger bruising.  EXAM: LEFT HAND - COMPLETE 3+ VIEW  COMPARISON:  None.  FINDINGS: No acute fracture deformity. No dislocation. Bone mineral densities decreased without destructive bony lesions. Soft tissue planes are nonsuspicious. Moderate lateral wrist osteoarthrosis.  IMPRESSION: No acute fracture deformity or dislocations; osteopenia decreases sensitivity for acute nondisplaced fractures.   Electronically Signed   By: Awilda Metro   On: 06/14/2014 05:33   Ct Maxillofacial Wo Cm 06/14/2014   CLINICAL DATA:  Status post fall. Abrasion at the left side of the face. Concern for  head or cervical spine injury.  EXAM: CT HEAD WITHOUT CONTRAST  CT MAXILLOFACIAL WITHOUT CONTRAST  CT CERVICAL SPINE WITHOUT CONTRAST  TECHNIQUE: Multidetector CT imaging of the head, cervical spine, and maxillofacial structures were performed using the standard protocol without intravenous contrast. Multiplanar CT image reconstructions of the cervical spine and maxillofacial structures were also generated.  COMPARISON:  CT of the cervical spine performed 09/13/2013, and CT of the head performed 12/17/2013  FINDINGS: CT HEAD FINDINGS  There is no evidence of acute infarction, mass lesion, or intra- or extra-axial hemorrhage on CT.  Prominence of the ventricles and sulci suggests mild to moderate cortical volume loss. Mild periventricular white matter change likely reflects small vessel ischemic microangiopathy. Cerebellar atrophy is noted.  The brainstem and fourth ventricle are within normal limits. The basal ganglia are unremarkable in appearance. The cerebral hemispheres demonstrate grossly normal gray-white differentiation. No mass effect or midline shift  is seen.  There is no evidence of fracture; visualized osseous structures are unremarkable in appearance. The visualized portions of the orbits are within normal limits. The paranasal sinuses and mastoid air cells are well-aerated. No significant soft tissue abnormalities are seen.  CT MAXILLOFACIAL FINDINGS  There is no evidence of fracture or dislocation. The maxilla and mandible appear intact. The nasal bone is unremarkable in appearance. The visualized dentition demonstrates no acute abnormality.  There appears to be anterior subluxation of the left temporomandibular joint, and mild degenerative change is noted at the right temporomandibular joint.  The orbits are intact bilaterally. The visualized paranasal sinuses and mastoid air cells are well-aerated.  Soft tissue swelling is noted overlying the left zygomatic arch. The parapharyngeal fat planes are  preserved. The nasopharynx, oropharynx and hypopharynx are unremarkable in appearance. The visualized portions of the valleculae and piriform sinuses are grossly unremarkable.  The parotid and submandibular glands are within normal limits. No cervical lymphadenopathy is seen.  CT CERVICAL SPINE FINDINGS  There is no evidence of acute fracture or subluxation. There is minimal grade 1 retrolisthesis of C5 on C6, and grade 1 anterolisthesis of C7 on T1. Underlying multilevel disc space narrowing is noted, with scattered anterior and posterior disc osteophyte complexes. Underlying facet disease is seen along the cervical spine. Prevertebral soft tissues are within normal limits.  The dens is difficult to fully assess due to motion artifact, but appears grossly intact on axial and coronal images.  The visualized portions of the thyroid gland are unremarkable in appearance. No significant soft tissue abnormalities are seen.  IMPRESSION: 1. No evidence of traumatic intracranial injury or fracture. 2. No evidence of fracture or dislocation with regard to the maxillofacial structures. 3. No evidence of acute fracture or subluxation along the cervical spine. 4. Soft tissue swelling noted overlying the left zygomatic arch. 5. Mild to moderate cortical volume loss and scattered small vessel ischemic microangiopathy. 6. Apparent anterior subluxation of the left temporomandibular joint, and mild degenerative change at the right temporomandibular joint. Would correlate for associated symptoms. 7. Degenerative change noted along the cervical spine.   Electronically Signed   By: Roanna Raider M.D.   On: 06/14/2014 03:30    0545:  XR/CT reassuring. CT maxillofacial with known left TMJ subluxation, confirmed by family at bedside. Family states pt's Td is UTD. Wound care provided to facial abrasion. Family would like pt to go back to NH now. Dx and testing d/w pt and family.  Questions answered.  Verb understanding, agreeable to  d/c back to NH with outpt f/u.    Samuel Jester, DO 06/17/14 [id number]

## 2014-06-14 NOTE — Discharge Instructions (Signed)
°Emergency Department Resource Guide °1) Find a Doctor and Pay Out of Pocket °Although you won't have to find out who is covered by your insurance plan, it is a good idea to ask around and get recommendations. You will then need to call the office and see if the doctor you have chosen will accept you as a new patient and what types of options they offer for patients who are self-pay. Some doctors offer discounts or will set up payment plans for their patients who do not have insurance, but you will need to ask so you aren't surprised when you get to your appointment. ° °2) Contact Your Local Health Department °Not all health departments have doctors that can see patients for sick visits, but many do, so it is worth a call to see if yours does. If you don't know where your local health department is, you can check in your phone book. The CDC also has a tool to help you locate your state's health department, and many state websites also have listings of all of their local health departments. ° °3) Find a Walk-in Clinic °If your illness is not likely to be very severe or complicated, you may want to try a walk in clinic. These are popping up all over the country in pharmacies, drugstores, and shopping centers. They're usually staffed by nurse practitioners or physician assistants that have been trained to treat common illnesses and complaints. They're usually fairly quick and inexpensive. However, if you have serious medical issues or chronic medical problems, these are probably not your best option. ° °No Primary Care Doctor: °- Call [facility] at  832-8000 - they can help you locate a primary care doctor that  accepts your insurance, provides certain services, etc. °- Physician Referral Service- 1-800-533-3463 ° °Chronic Pain Problems: °Organization         Address  Phone   Notes  °[facility]  (336) 297-2271 Patients need to be referred by their primary care doctor.  ° °Medication  Assistance: °Organization         Address  Phone   Notes  °[place] [facility] [address] °[place], [place] [address] (336) 641-8030 --Must be a resident of [place] °-- Must have NO insurance coverage whatsoever (no Medicaid/ Medicare, etc.) °-- The pt. MUST have a primary care doctor that directs their care regularly and follows them in the community °  °[facility]  (866) 331-1348   °[employer]  (888) 892-1162   ° °Agencies that provide inexpensive medical care: °Organization         Address  Phone   Notes  °[facility]  (336) 832-8035   °[facility]    (336) 832-7272   °[facility] [address] °[place], [place] [address] (336) 832-4777   °[facility] [address], °[place] (336) 271-4999   °[facility]    (336) 373-0678   °[facility]    (336) 272-1050   °[facility] ° [address], [place] Phone:  (336) 832-4444, Fax:  (336) 832-4440 Hours of Operation:  9 am - 6 pm, M-F.  Also accepts Medicaid/Medicare and self-pay.  °[facility] ° [address], [place] Phone: (336) 832-3150, Fax: (336) 832-3151. Hours of Operation:  8:30 am - 5:30 pm, M-F.  Also accepts Medicaid and self-pay.  °[facility] [place] [address], [place] Phone: (336) 878-6027   °[facility] [address], [place], [place] (336)723-1848, Ext. 123 Mondays & Thursdays: 7-9 AM.  First 15 patients are seen on a first come, first serve basis. °  ° °Medicaid-accepting [place] Providers: ° °Organization         Address  Phone   Notes  °[facility] [address], [place] (336) 641-2100 Also accepts self-pay patients.  °[facility] [address], [place] ° (336) 856-9996   °[facility] [address], [place]  (336) 288-8857   °[facility] [address] [place] [address], [place] (336) 299-7000   °Veita Bland [address], [place]  ° (336) 373-1557 Only accepts [place] Access Medicaid patients after they have their name applied to their card.  ° °Self-Pay (no insurance) in [place]: ° °Organization         Address  Phone   Notes  °Sickle Cell Patients, [facility] [address], [place] (336) 832-1970   °[facility] [address], [place] (336) 832-4400   °[facility] ° [address], [place] (336) 992-4800   °[facility]/Dr. Osei-Bonsu ° [address] [place] [address], [place] or [address], [place] (336) 841-8500 Phone number for both [place] and [place] locations is the same.  °[facility] [address], [place] (336) 299-0000   °[facility] [address] [place] [address], [place] or [address] (336) 852-7530 °(336) 878-2260   °[facility] [address], [place] (336) 350-1642, phone; (336) 294-5005, fax Sees patients 1st and 3rd Saturday of every month.  Must not qualify for public or private insurance (i.e. Medicaid, Medicare, [place] Health Choice, Veterans' Benefits) • Household income should be no more than 200% of the poverty level •The clinic cannot treat you if you are pregnant or think you are pregnant • Sexually transmitted diseases are not treated at the clinic.  ° ° °Dental Care: °Organization         Address  Phone  Notes  °[place] [facility] [address], [place] (336) 641-6152 Accepts children up to age 21 who are enrolled in Medicaid or [place] Health Choice; pregnant women with a Medicaid card; and children who have applied for Medicaid or [place] Health Choice, but were declined, whose parents can pay a reduced fee at time of service.  °[place]  [facility] [place]  [address], [place] (336) 641-7733 Accepts children up to age 21 who are enrolled in Medicaid or [place] Health Choice; pregnant women with a Medicaid card; and children who have applied for Medicaid or [place] Health Choice, but were declined, whose parents can pay a reduced fee at time of service.  °[facility] ° [address], [place] (336) 641-4533 Patients are seen by appointment only. Walk-ins are not accepted. [facility] will see patients 18 years of age and older. °Monday - Tuesday (8am-5pm) °Most Wednesdays (8:30-5pm) °$30 per visit, cash only  °[facility] ° [address], [place] (336) 641-4533 Patients are seen by appointment only. Walk-ins are not accepted. [facility] will see patients 18 years of age and older. °One   Wednesday Evening (Monthly: Volunteer Based).  $30 per visit, cash only  °[facility]  (919) 537-3737 for adults; Children under age 4, call Graduate Pediatric Dentistry at (919) [phone]. Children aged 4-14, please call (919) [phone] to request a pediatric application. ° Dental services are provided in all areas of dental care including fillings, crowns and bridges, complete and partial dentures, implants, gum treatment, root canals, and extractions. Preventive care is also provided. Treatment is provided to both adults and children. °Patients are selected via a lottery and there is often a waiting list. °  °[facility] [address], °[place] ° (336) 763-8833 [email or web address] °  °[facility] [address], [place], [place] (336)723-1848, Ext. 123 Second and Fourth Thursday of each month, opens at 6:30 AM; Clinic ends at 9 AM.  Patients are seen on a first-come first-served basis, and a limited number are seen during each clinic.  ° °[facility] ° [address], [place], [place] (336) 723-7904    Eligibility Requirements °You must have lived in [place], [place], or [place] counties for at least the last three months. °  You cannot be eligible for state or federal sponsored healthcare insurance, including Veterans Administration, Medicaid, or Medicare. °  You generally cannot be eligible for healthcare insurance through your employer.  °  How to apply: °Eligibility screenings are held every Tuesday and Wednesday afternoon from 1:00 pm until 4:00 pm. You do not need an appointment for the interview!  °[facility] [address], [place], [place] 336-631-2330   °[facility]  336-342-8273   °[facility]  336-703-3100   °[facility]  336-570-6415   ° °Behavioral Health Resources in the Community: °Intensive Outpatient Programs °Organization         Address  Phone  Notes  °[facility] [address], [place], [place] 336-878-6098   °[facility] [address], [place], [place] 336-832-9800   °[facility] [address], [place], [place] ° 336-882-2125   °[facility] [address],  °[place], [place] 1-800-853-5163 or 336-641-4981   °Substance Abuse Resources °Organization         Address  Phone  Notes  °[facility]  336-882-2125   °[facility]  336-784-9470   °[facility]  336-285-9073   °[facility]  336-845-3988   °Residential & Outpatient Substance Abuse Program  1-800-659-3381   °Psychological Services °Organization         Address  Phone  Notes  °[facility]  336- 832-9600   °[facility]  336- 378-7881   °[facility] [address], [place] 1-800-853-5163 or 336-641-4981   ° °Mobile Crisis Teams °Organization         Address  Phone  Notes  °[facility], Mobile Crisis Care Unit  1-877-626-1772   °Assertive °[facility] ° [address]  [place], [place] 336-834-9664   °Sharon DeEsch [address] °[place] 336-554-5454   ° °Self-Help/Support Groups °Organization         Address  Phone             Notes  °[facility]  - variety of support groups  336- 373-1402 Call for more information  °Narcotics Anonymous (NA), [facility] [address], °[place]  2 meetings at this location  ° °  Residential Treatment Programs Organization         Address  Phone  Notes  [facility] [address],    [place]  4-098-119-1478   [facility]  [address] [place] [address], [place], [place] 308-657-8469   [facility] [address] [place], [place] 629-528-4132 Admissions: 8am-3pm M-F  [facility] [address]    [place], [place] 440-102-7253   [facility] [address] [place], [place], [place] 664-403-4742   [facility] [address],  [place], [place] 595-638-7564   [facility] Intensive Outpatient [address], [place], [place] 332-951-8841   [facility] ([facility] [address] [place].,  [place], [place] 6-606-301-6010 or 6617337273   [facility] [address] [place], [place] 025-427-0623 Accepts Medicaid  [facility] [address]  [place] 7-628-315-1761 Substance Abuse/Addiction Treatment   [facility]         Address  Phone  Notes  [facility]  [phone]   Angie Fava, PhD [address] [place]ack [place], [place]   719 094 7895 or 901-586-7138   [facility]   [address] [place], [place] [phone]   [facility] [address] [place], [place] (267)028-2039 Insurance/Medicaid/sponsorship through [facility] [address]                                    [place], [place] 321-704-9174 Therapy/tele-psych/case    [facility] [address], [place] [phone]    Dr. Lolly Mustache  (443)416-1856   [facility] [place]  [employer] [facility] Dept. 1) [address] [place] 2) [address] [place] [address], [place] 3)  [address], [place] [phone] (920)[phone]  (262) [phone]   [facility] Child Abuse Hotline 636-504-3068 or [phone] (After Hours)       Take your usual prescriptions as previously directed.  Wash the area with soap and water at least twice a day, and cover with a clean/dry dressing.  Change the dressing whenever it becomes wet or soiled after washing the area with soap and water. Apply moist ice to the area(s) of discomfort, for 15 minutes at a time, several times per day for the next few days.  Do not fall asleep on an ice pack.  Call your regular medical doctor today to schedule a follow up appointment in the next 2 days. Return to the Emergency Department immediately if worsening.

## 2014-06-14 NOTE — ED Notes (Signed)
Pt had unwitnessed fall at nursing facility.

## 2014-06-19 ENCOUNTER — Encounter (HOSPITAL_COMMUNITY): Payer: Self-pay | Admitting: Emergency Medicine

## 2014-06-19 ENCOUNTER — Emergency Department (HOSPITAL_COMMUNITY): Payer: Medicare Other

## 2014-06-19 ENCOUNTER — Emergency Department (HOSPITAL_COMMUNITY)
Admission: EM | Admit: 2014-06-19 | Discharge: 2014-06-19 | Disposition: A | Discharge status: Skilled Nursing Facility | Payer: Medicare Other | Attending: Emergency Medicine | Admitting: Emergency Medicine

## 2014-06-19 DIAGNOSIS — S43402A Unspecified sprain of left shoulder joint, initial encounter: Secondary | ICD-10-CM

## 2014-06-19 DIAGNOSIS — Z862 Personal history of diseases of the blood and blood-forming organs and certain disorders involving the immune mechanism: Secondary | ICD-10-CM | POA: Insufficient documentation

## 2014-06-19 DIAGNOSIS — M129 Arthropathy, unspecified: Secondary | ICD-10-CM | POA: Insufficient documentation

## 2014-06-19 DIAGNOSIS — Z8781 Personal history of (healed) traumatic fracture: Secondary | ICD-10-CM | POA: Diagnosis not present

## 2014-06-19 DIAGNOSIS — IMO0002 Reserved for concepts with insufficient information to code with codable children: Secondary | ICD-10-CM | POA: Diagnosis not present

## 2014-06-19 DIAGNOSIS — W19XXXA Unspecified fall, initial encounter: Secondary | ICD-10-CM

## 2014-06-19 DIAGNOSIS — Z7982 Long term (current) use of aspirin: Secondary | ICD-10-CM | POA: Insufficient documentation

## 2014-06-19 DIAGNOSIS — Y929 Unspecified place or not applicable: Secondary | ICD-10-CM | POA: Diagnosis not present

## 2014-06-19 DIAGNOSIS — Z79899 Other long term (current) drug therapy: Secondary | ICD-10-CM | POA: Insufficient documentation

## 2014-06-19 DIAGNOSIS — Y939 Activity, unspecified: Secondary | ICD-10-CM | POA: Diagnosis not present

## 2014-06-19 DIAGNOSIS — Z792 Long term (current) use of antibiotics: Secondary | ICD-10-CM | POA: Insufficient documentation

## 2014-06-19 DIAGNOSIS — R296 Repeated falls: Secondary | ICD-10-CM | POA: Insufficient documentation

## 2014-06-19 DIAGNOSIS — S46909A Unspecified injury of unspecified muscle, fascia and tendon at shoulder and upper arm level, unspecified arm, initial encounter: Secondary | ICD-10-CM | POA: Insufficient documentation

## 2014-06-19 DIAGNOSIS — H409 Unspecified glaucoma: Secondary | ICD-10-CM | POA: Insufficient documentation

## 2014-06-19 DIAGNOSIS — F411 Generalized anxiety disorder: Secondary | ICD-10-CM | POA: Diagnosis not present

## 2014-06-19 DIAGNOSIS — Z872 Personal history of diseases of the skin and subcutaneous tissue: Secondary | ICD-10-CM | POA: Diagnosis not present

## 2014-06-19 DIAGNOSIS — Z86718 Personal history of other venous thrombosis and embolism: Secondary | ICD-10-CM | POA: Insufficient documentation

## 2014-06-19 DIAGNOSIS — S4980XA Other specified injuries of shoulder and upper arm, unspecified arm, initial encounter: Secondary | ICD-10-CM | POA: Insufficient documentation

## 2014-06-19 DIAGNOSIS — F0390 Unspecified dementia without behavioral disturbance: Secondary | ICD-10-CM | POA: Diagnosis not present

## 2014-06-19 NOTE — ED Provider Notes (Signed)
CSN: [id number]     Arrival date & time 06/19/14  0223 History   First MD Initiated Contact with Patient 06/19/14 0226     Chief Complaint  Patient presents with  . Fall      Patient is a 80 y.o. female presenting with fall. The history is provided by the patient and the nursing home (nurse Hunt[place] Dec from nursing facility). The history is limited by the condition of the patient.  Fall This is a new problem. Pertinent negatives include no chest pain, no abdominal pain and no headaches. The symptoms are aggravated by walking. The symptoms are relieved by rest.  pt presents for fall She lives in nursing facility and was found to lying in floor by nursing staff just prior to arrival Pt has h/o frequent falls.  She has h/o dementia which limits history Nursing staff reports when they found her she reported hitting her head and had left arm pain She was otherwise at her baseline  Pt is not on anticoagulants   Past Medical History  Diagnosis Date  . Hypertension   . Dementia   . Glaucoma   . Hip fracture, left   . Cellulitis   . Anemia   . Left leg DVT   . Anxiety   . Insomnia   . Arthritis   . Chronic ear infection   . 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 Orif hip fracture  05/23/2011    Procedure: OPEN REDUCTION INTERNAL FIXATION HIP;  Surgeon: Fuller Canada, MD;  Location: [facility];  Service: Orthopedics;  Laterality: Left;  with Gamma Nail  . Hip arthrotomy    . Abdominal hysterectomy     History reviewed. No pertinent family history. History  Substance Use Topics  . Smoking status: Never Smoker   . Smokeless tobacco: Never Used  . Alcohol Use: No   OB History   Grav Para Term Preterm Abortions TAB SAB Ect Mult Living            0     Review of Systems  Unable to perform ROS: Dementia  Cardiovascular: Negative for chest pain.  Gastrointestinal: Negative for abdominal pain.  Neurological:  Negative for headaches.      Allergies  Review of patient's allergies indicates no known allergies.  Home Medications   Prior to Admission medications   Medication Sig Start Date End Date Taking? Authorizing Provider  ASPIRIN LOW DOSE 81 MG EC tablet TAKE 1 TABLET BY MOUTH ONCE DAILY. 09/30/13  Yes Babs Sciara, MD  citalopram (CELEXA) 10 MG tablet TAKE (1) TABLET BY MOUTH EACH MORNING.   Yes Merlyn Albert, MD  Cranberry 425 MG CAPS TAKE 1 CAPSULE BY MOUTH ONCE DAILY. 01/05/14  Yes Babs Sciara, MD  GLYCERIN ADULT 2 G suppository INSERT 1 SUPPOSITORY RECTALLY ONCE DAILY AS NEEDED FOR BOWEL MOVEMENT. 04/04/14  Yes Merlyn Albert, MD  lisinopril (PRINIVIL,ZESTRIL) 5 MG tablet TAKE 1 TABLET BY MOUTH EVERY MORNING. 01/03/14  Yes Merlyn Albert, MD  loperamide (IMODIUM) 2 MG capsule Take 2 mg by mouth every 6 (six) hours as needed for diarrhea or loose stools.  06/14/13  Yes Historical Provider, MD  loratadine (CLARITIN) 10 MG tablet Take 10 mg by mouth daily.   Yes Historical Provider, MD  LUMIGAN 0.01 % SOLN INSTILL 1 DROP INTO EACH EYE AT BEDTIME. 02/17/14  Yes Babs Sciara, MD  Multiple Vitamin (  DAILY VITE) TABS TAKE 1 TABLET BY MOUTH ONCE DAILY. 05/29/14  Yes Merlyn Albert, MD  OLANZapine (ZYPREXA) 5 MG tablet Take 5 mg by mouth at bedtime.   Yes Historical Provider, MD  Polyethyl Glycol-Propyl Glycol (SYSTANE OP) Apply 1 drop to eye 4 (four) times daily.   Yes Historical Provider, MD  polyethylene glycol powder (GLYCOLAX/MIRALAX) powder MIX 1 CAPFUL (17 G) IN 8 OUNCES OF JUICE/WATER AND DRINK ONCE DAILY. 06/22/13  Yes Merlyn Albert, MD  Q-PAP 500 MG tablet TAKE 2 TABLETS BY MOUTH EVERY 4 HOURS AS NEEDED FOR PAIN. NO MORE THAN 2 DOSES IN 24 HOURS. 11/14/13  Yes Merlyn Albert, MD  QC NASAL RELIEF SINUS 0.05 % nasal spray INHALE 2 SPRAYS INTO EACH NOSTRIL EVERY 6 HOURS AS NEEDED FOR NASAL IRRITATION. 09/30/13  Yes Babs Sciara, MD  STOOL SOFTENER 100 MG capsule TAKE 1 CAPSULE BY  MOUTH ONCE DAILY. 10/31/13  Yes Merlyn Albert, MD  sulfamethoxazole-trimethoprim (BACTRIM DS) 800-160 MG per tablet Take 1 tablet by mouth 2 (two) times daily. 06/14/14  Yes Historical Provider, MD  LORazepam (ATIVAN) 1 MG tablet Take 0.5 tablets (0.5 mg total) by mouth every 8 (eight) hours as needed for anxiety. 07/23/13   Benny Lennert, MD  SENNALAX-S 8.6-50 MG per tablet TAKE 1 TABLET BY MOUTH ONCE DAILY AS NEEDED FOR CONSTIPATION. 04/11/14   Merlyn Albert, MD   BP 192/78  Pulse 67  Temp(Src) 98.1 F (36.7 C) (Oral)  Resp 24  SpO2 95% Physical Exam CONSTITUTIONAL: elderly, pleasant and in no distress HEAD: Normocephalic/atraumatic EYES: EOMI ENMT: Mucous membranes moist. Bruising to left maxilla noted.  No other signs of facial trauma Face is stable without crepitus NECK: supple no meningeal signs SPINE:entire spine nontender, No bruising/crepitance/stepoffs noted to spine CV: S1/S2 noted Chest - nontender to palpation LUNGS: Lungs are clear to auscultation bilaterally, no apparent distress ABDOMEN: soft, nontender, no rebound or guarding NEURO: Pt is awake/alert, moves all extremitiesx4.  She is pleasantly confused She follows commands, and has no focal weakness in any of her extremities EXTREMITIES: pulses normal, full ROM Tenderness to palpation of left shoulder/humerus but no deformity or bruising noted All other extremities/joints palpated/ranged and nontender No tenderness to ROM of either lower extremity/hip SKIN: warm, color normal PSYCH: no abnormalities of mood noted  ED Course  Procedures 2:43 AM Pt presents s/p fall at nursing facility She has bruising to face though this is likely from fall on 9/16 when she was seen in the ED and had left facial hematoma at that time.   No signs of acute head injury tonight She is unable to tell me if she had LOC She also reports left arm pain but no other complaints Will imaging left humerus and reassess 3:31 AM Xray  negative Pt has no other complaints She has no signs of head trauma and denies headache She is able to ambulate with assistance (she uses walker baseline) and is able to bear weight on both legs without any difficulty  Imaging Review Dg Humerus Left  06/19/2014   CLINICAL DATA:  Arm pain.  EXAM: LEFT HUMERUS - 2+ VIEW  COMPARISON:  05/21/2011  FINDINGS: Diagnostic sensitivity is decreased by underpenetration.  There is no definitive fracture or dislocation.  Advanced narrowing of the glenohumeral joint with large inferior osteophytes and chondrocalcinosis. There is degenerative remodeling/flattening of the humeral head.  IMPRESSION: 1. No acute osseous findings. 2. Advanced glenohumeral osteoarthritis.   Electronically Signed  By: Tiburcio Pea M.D.   On: 06/19/2014 03:13      MDM   Final diagnoses:  Fall, initial encounter  Shoulder sprain, left, initial encounter    Nursing notes including past medical history and social history reviewed and considered in documentation xrays reviewed and considered Previous records reviewed and considered     Joya Gaskins, MD 06/19/14 [phone]

## 2014-06-19 NOTE — ED Notes (Signed)
Found lying in the floor when staff was doing their rounds at [facility]. Patient complaining of left arm pain. Currently being treated for a UTI.

## 2014-07-20 ENCOUNTER — Ambulatory Visit (INDEPENDENT_AMBULATORY_CARE_PROVIDER_SITE_OTHER): Payer: Medicare Other | Admitting: Otolaryngology

## 2014-07-20 DIAGNOSIS — H6123 Impacted cerumen, bilateral: Secondary | ICD-10-CM

## 2014-07-20 DIAGNOSIS — H6043 Cholesteatoma of external ear, bilateral: Secondary | ICD-10-CM

## 2014-08-10 ENCOUNTER — Emergency Department (HOSPITAL_COMMUNITY)
Admission: EM | Admit: 2014-08-10 | Discharge: 2014-08-10 | Disposition: A | Discharge status: Home / Self Care | Payer: Medicare Other | Attending: Emergency Medicine | Admitting: Emergency Medicine

## 2014-08-10 ENCOUNTER — Encounter (HOSPITAL_COMMUNITY): Payer: Self-pay

## 2014-08-10 ENCOUNTER — Emergency Department (HOSPITAL_COMMUNITY): Payer: Medicare Other

## 2014-08-10 DIAGNOSIS — F419 Anxiety disorder, unspecified: Secondary | ICD-10-CM | POA: Insufficient documentation

## 2014-08-10 DIAGNOSIS — F0390 Unspecified dementia without behavioral disturbance: Secondary | ICD-10-CM | POA: Diagnosis not present

## 2014-08-10 DIAGNOSIS — Z792 Long term (current) use of antibiotics: Secondary | ICD-10-CM | POA: Insufficient documentation

## 2014-08-10 DIAGNOSIS — M1990 Unspecified osteoarthritis, unspecified site: Secondary | ICD-10-CM | POA: Insufficient documentation

## 2014-08-10 DIAGNOSIS — Z862 Personal history of diseases of the blood and blood-forming organs and certain disorders involving the immune mechanism: Secondary | ICD-10-CM | POA: Insufficient documentation

## 2014-08-10 DIAGNOSIS — H409 Unspecified glaucoma: Secondary | ICD-10-CM | POA: Insufficient documentation

## 2014-08-10 DIAGNOSIS — R531 Weakness: Secondary | ICD-10-CM | POA: Insufficient documentation

## 2014-08-10 DIAGNOSIS — R4182 Altered mental status, unspecified: Secondary | ICD-10-CM

## 2014-08-10 DIAGNOSIS — Z872 Personal history of diseases of the skin and subcutaneous tissue: Secondary | ICD-10-CM | POA: Insufficient documentation

## 2014-08-10 DIAGNOSIS — Z8781 Personal history of (healed) traumatic fracture: Secondary | ICD-10-CM | POA: Insufficient documentation

## 2014-08-10 DIAGNOSIS — Z79899 Other long term (current) drug therapy: Secondary | ICD-10-CM | POA: Insufficient documentation

## 2014-08-10 DIAGNOSIS — Z87828 Personal history of other (healed) physical injury and trauma: Secondary | ICD-10-CM | POA: Insufficient documentation

## 2014-08-10 DIAGNOSIS — Z7982 Long term (current) use of aspirin: Secondary | ICD-10-CM | POA: Insufficient documentation

## 2014-08-10 DIAGNOSIS — Z86718 Personal history of other venous thrombosis and embolism: Secondary | ICD-10-CM | POA: Diagnosis not present

## 2014-08-10 HISTORY — DX: Fracture of unspecified part of neck of unspecified femur, initial encounter for closed fracture: S72.009A

## 2014-08-10 LAB — URINALYSIS, ROUTINE W REFLEX MICROSCOPIC
Bilirubin Urine: NEGATIVE
GLUCOSE, UA: NEGATIVE mg/dL
Hgb urine dipstick: NEGATIVE
Ketones, ur: NEGATIVE mg/dL
LEUKOCYTES UA: NEGATIVE
Nitrite: NEGATIVE
Protein, ur: NEGATIVE mg/dL
SPECIFIC GRAVITY, URINE: 1.02 (ref 1.005–1.030)
Urobilinogen, UA: 0.2 mg/dL (ref 0.0–1.0)
pH: 6.5 (ref 5.0–8.0)

## 2014-08-10 LAB — CBC WITH DIFFERENTIAL/PLATELET
Basophils Absolute: 0 10*3/uL (ref 0.0–0.1)
Basophils Relative: 0 % (ref 0–1)
Eosinophils Absolute: 0 10*3/uL (ref 0.0–0.7)
Eosinophils Relative: 0 % (ref 0–5)
HCT: 32.6 % — ABNORMAL LOW (ref 36.0–46.0)
HEMOGLOBIN: 10.7 g/dL — AB (ref 12.0–15.0)
LYMPHS ABS: 0.9 10*3/uL (ref 0.7–4.0)
LYMPHS PCT: 9 % — AB (ref 12–46)
MCH: 31.8 pg (ref 26.0–34.0)
MCHC: 32.8 g/dL (ref 30.0–36.0)
MCV: 96.7 fL (ref 78.0–100.0)
Monocytes Absolute: 0.5 10*3/uL (ref 0.1–1.0)
Monocytes Relative: 6 % (ref 3–12)
NEUTROS ABS: 8 10*3/uL — AB (ref 1.7–7.7)
NEUTROS PCT: 85 % — AB (ref 43–77)
Platelets: 226 10*3/uL (ref 150–400)
RBC: 3.37 MIL/uL — ABNORMAL LOW (ref 3.87–5.11)
RDW: 14.5 % (ref 11.5–15.5)
WBC: 9.5 10*3/uL (ref 4.0–10.5)

## 2014-08-10 LAB — COMPREHENSIVE METABOLIC PANEL
ALBUMIN: 3 g/dL — AB (ref 3.5–5.2)
ALT: 7 U/L (ref 0–35)
AST: 15 U/L (ref 0–37)
Alkaline Phosphatase: 83 U/L (ref 39–117)
Anion gap: 11 (ref 5–15)
BUN: 20 mg/dL (ref 6–23)
CALCIUM: 9 mg/dL (ref 8.4–10.5)
CO2: 27 meq/L (ref 19–32)
Chloride: 104 mEq/L (ref 96–112)
Creatinine, Ser: 1.07 mg/dL (ref 0.50–1.10)
GFR calc Af Amer: 47 mL/min — ABNORMAL LOW (ref >90)
GFR, EST NON AFRICAN AMERICAN: 41 mL/min — AB (ref >90)
Glucose, Bld: 146 mg/dL — ABNORMAL HIGH (ref 70–99)
Potassium: 4.1 mEq/L (ref 3.7–5.3)
SODIUM: 142 meq/L (ref 137–147)
Total Bilirubin: 0.3 mg/dL (ref 0.3–1.2)
Total Protein: 7.7 g/dL (ref 6.0–8.3)

## 2014-08-10 LAB — TROPONIN I

## 2014-08-10 MED ORDER — SODIUM CHLORIDE 0.9 % IV SOLN
Freq: Once | INTRAVENOUS | Status: AC
  Administered 2014-08-10: 20:00:00 via INTRAVENOUS

## 2014-08-10 NOTE — ED Provider Notes (Signed)
CSN: [id number]     Arrival date & time 08/10/14  1837 History  This chart was scribed for Benny LennertJoseph L Aronda Burford, MD by Annye AsaAnna Dorsett, ED Scribe. This patient was seen in room [facility]15/[facility]15 and the patient's care was started at 6:54 PM.    Chief Complaint  Patient presents with  . Weakness  . Altered Mental Status   Patient is a [age] y.o. female presenting with weakness and altered mental status. The history is provided by the EMS personnel and the nursing home. The history is limited by the condition of the patient. No language interpreter was used.  Weakness This is a new problem. The current episode started less than 1 hour ago. The problem occurs constantly. The problem has been gradually improving. Pertinent negatives include no chest pain, no abdominal pain and no headaches. Nothing aggravates the symptoms. Nothing relieves the symptoms.  Altered Mental Status Associated symptoms: agitation and weakness   Associated symptoms: no abdominal pain, no hallucinations, no headaches, no rash and no seizures     Level 5 Caveat; Unresponsive/AMS  HPI Comments: Wellington Hampshirellen B Pergola is a [age] y.o. female who presents to the Emergency Department complaining of AMS. EMS reports that patient is a resident of [facility]; staff noticed around 18:00 today that patient had decreased alertness and was weak but combative. They report a history of UTI; staff says patient is usually alert and oriented.    Past Medical History  Diagnosis Date  . Hypertension   . Dementia   . Glaucoma   . Hip fracture, left   . Cellulitis   . Anemia   . Left leg DVT   . Anxiety   . Insomnia   . Arthritis   . Chronic ear infection   . 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 Hip fracture    Past Surgical History  Procedure Laterality Date  . Orif hip fracture  05/23/2011    Procedure: OPEN REDUCTION INTERNAL FIXATION HIP;  Surgeon: Fuller CanadaStanley Harrison, MD;  Location: [facility];  Service: Orthopedics;  Laterality: Left;   with Gamma Nail  . Hip arthrotomy    . Abdominal hysterectomy     No family history on file. History  Substance Use Topics  . Smoking status: Never Smoker   . Smokeless tobacco: Never Used  . Alcohol Use: No   OB History    Gravida Para Term Preterm AB TAB SAB Ectopic Multiple Living            0     Review of Systems  Constitutional: Negative for appetite change and fatigue.  HENT: Negative for congestion, ear discharge and sinus pressure.   Eyes: Negative for discharge.  Respiratory: Negative for cough.   Cardiovascular: Negative for chest pain.  Gastrointestinal: Negative for abdominal pain and diarrhea.  Genitourinary: Negative for frequency and hematuria.  Musculoskeletal: Negative for back pain.  Skin: Negative for rash.  Neurological: Positive for weakness. Negative for seizures and headaches.  Psychiatric/Behavioral: Positive for agitation. Negative for hallucinations.      Allergies  Review of patient's allergies indicates no known allergies.  Home Medications   Prior to Admission medications   Medication Sig Start Date End Date Taking? Authorizing Provider  ASPIRIN LOW DOSE 81 MG EC tablet TAKE 1 TABLET BY MOUTH ONCE DAILY. 09/30/13   Babs SciaraScott A Luking, MD  citalopram (CELEXA) 10 MG tablet TAKE (1) TABLET BY MOUTH EACH MORNING.    Merlyn AlbertWilliam S Luking, MD  Cranberry [phone]  MG CAPS TAKE 1 CAPSULE BY MOUTH ONCE DAILY. 01/05/14   Babs Sciara, MD  GLYCERIN ADULT 2 G suppository INSERT 1 SUPPOSITORY RECTALLY ONCE DAILY AS NEEDED FOR BOWEL MOVEMENT. 04/04/14   Merlyn Albert, MD  lisinopril (PRINIVIL,ZESTRIL) 5 MG tablet TAKE 1 TABLET BY MOUTH EVERY MORNING. 01/03/14   Merlyn Albert, MD  loperamide (IMODIUM) 2 MG capsule Take 2 mg by mouth every 6 (six) hours as needed for diarrhea or loose stools.  06/14/13   Historical Provider, MD  loratadine (CLARITIN) 10 MG tablet Take 10 mg by mouth daily.    Historical Provider, MD  LORazepam (ATIVAN) 1 MG tablet Take 0.5 tablets (0.5 mg  total) by mouth every 8 (eight) hours as needed for anxiety. 07/23/13   Benny Lennert, MD  LUMIGAN 0.01 % SOLN INSTILL 1 DROP INTO [facility] EYE AT BEDTIME. 02/17/14   Babs Sciara, MD  Multiple Vitamin (DAILY VITE) TABS TAKE 1 TABLET BY MOUTH ONCE DAILY. 05/29/14   Merlyn Albert, MD  OLANZapine (ZYPREXA) 5 MG tablet Take 5 mg by mouth at bedtime.    Historical Provider, MD  Polyethyl Glycol-Propyl Glycol (SYSTANE OP) Apply 1 drop to eye 4 (four) times daily.    Historical Provider, MD  polyethylene glycol powder (GLYCOLAX/MIRALAX) powder MIX 1 CAPFUL (17 G) IN 8 OUNCES OF JUICE/WATER AND DRINK ONCE DAILY. 06/22/13   Merlyn Albert, MD  Q-PAP 500 MG tablet TAKE 2 TABLETS BY MOUTH EVERY 4 HOURS AS NEEDED FOR PAIN. NO MORE THAN 2 DOSES IN 24 HOURS. 11/14/13   Merlyn Albert, MD  QC NASAL RELIEF SINUS 0.05 % nasal spray INHALE 2 SPRAYS INTO EACH NOSTRIL EVERY 6 HOURS AS NEEDED FOR NASAL IRRITATION. 09/30/13   Babs Sciara, MD  SENNALAX-S 8.6-50 MG per tablet TAKE 1 TABLET BY MOUTH ONCE DAILY AS NEEDED FOR CONSTIPATION. 04/11/14   Merlyn Albert, MD  STOOL SOFTENER 100 MG capsule TAKE 1 CAPSULE BY MOUTH ONCE DAILY. 10/31/13   Merlyn Albert, MD  sulfamethoxazole-trimethoprim (BACTRIM DS) 800-160 MG per tablet Take 1 tablet by mouth 2 (two) times daily. 06/14/14   Historical Provider, MD   There were no vitals taken for this visit. Physical Exam  Constitutional: She is oriented to person, place, and time. She appears well-developed.  HENT:  Head: Normocephalic.  Eyes: Conjunctivae and EOM are normal. No scleral icterus.  Neck: Neck supple. No thyromegaly present.  Cardiovascular: Normal rate and regular rhythm.  Exam reveals no gallop and no friction rub.   No murmur heard. Pulmonary/Chest: No stridor. She has no wheezes. She has no rales. She exhibits no tenderness.  Abdominal: She exhibits no distension. There is no tenderness. There is no rebound.  Musculoskeletal: Normal range of motion. She  exhibits no edema.  Lymphadenopathy:    She has no cervical adenopathy.  Neurological: She is oriented to person, place, and time. She exhibits normal muscle tone. Coordination normal.  Unresponsive; only responds to painful stimuli  Skin: No rash noted. No erythema.  Vitals reviewed.   ED Course  Procedures   DIAGNOSTIC STUDIES: Oxygen Saturation is 94% on RA, low by my interpretation.    COORDINATION OF CARE: 6:57 PM Discussed treatment plan with pt at bedside and pt agreed to plan.   Labs Review Labs Reviewed - No data to display  Imaging Review No results found.   EKG Interpretation None      MDM   Final diagnoses:  None  Pt alert and confused now.  Family states this is her nl.  [facility] will [facility] back [facility]   The chart was scribed for me under my direct supervision.  I personally performed the history, physical, and medical decision making and all procedures in the evaluation of this patient.Benny Lennert.      Clorene Nerio L Hinata Diener, MD 08/10/14 2110

## 2014-08-10 NOTE — ED Notes (Signed)
EMS reports pt resident of [facility] and today around 6pm staff noticed pt decreased LOC, weakness, and combative.  Reports history of UTI.  Says pt usually alert and oriented.  CBG per EMS 216.   BP 114/62, hr 70 and irregular, rr 15, o2 sat 94% on room air.

## 2014-08-10 NOTE — ED Notes (Signed)
During assessment pt noted to have skin tear to left forearm.

## 2014-08-10 NOTE — ED Notes (Signed)
Pt was discharged and transported back to [facility], by personnel, gave report to med [employer] Jessica.

## 2014-08-10 NOTE — Discharge Instructions (Signed)
Follow up with your md as needed °

## 2014-08-16 ENCOUNTER — Encounter (HOSPITAL_COMMUNITY): Payer: Self-pay

## 2014-08-16 ENCOUNTER — Emergency Department (HOSPITAL_COMMUNITY): Payer: Medicare Other

## 2014-08-16 ENCOUNTER — Emergency Department (HOSPITAL_COMMUNITY)
Admission: EM | Admit: 2014-08-16 | Discharge: 2014-08-16 | Disposition: A | Discharge status: Home / Self Care | Payer: Medicare Other | Attending: Emergency Medicine | Admitting: Emergency Medicine

## 2014-08-16 DIAGNOSIS — W1839XA Other fall on same level, initial encounter: Secondary | ICD-10-CM | POA: Diagnosis not present

## 2014-08-16 DIAGNOSIS — Z8781 Personal history of (healed) traumatic fracture: Secondary | ICD-10-CM | POA: Diagnosis not present

## 2014-08-16 DIAGNOSIS — Y9389 Activity, other specified: Secondary | ICD-10-CM | POA: Insufficient documentation

## 2014-08-16 DIAGNOSIS — Z7982 Long term (current) use of aspirin: Secondary | ICD-10-CM | POA: Diagnosis not present

## 2014-08-16 DIAGNOSIS — Y998 Other external cause status: Secondary | ICD-10-CM | POA: Insufficient documentation

## 2014-08-16 DIAGNOSIS — Y92128 Other place in nursing home as the place of occurrence of the external cause: Secondary | ICD-10-CM | POA: Insufficient documentation

## 2014-08-16 DIAGNOSIS — I10 Essential (primary) hypertension: Secondary | ICD-10-CM | POA: Insufficient documentation

## 2014-08-16 DIAGNOSIS — Z87828 Personal history of other (healed) physical injury and trauma: Secondary | ICD-10-CM | POA: Diagnosis not present

## 2014-08-16 DIAGNOSIS — Z86718 Personal history of other venous thrombosis and embolism: Secondary | ICD-10-CM | POA: Insufficient documentation

## 2014-08-16 DIAGNOSIS — Z043 Encounter for examination and observation following other accident: Secondary | ICD-10-CM | POA: Insufficient documentation

## 2014-08-16 DIAGNOSIS — G4700 Insomnia, unspecified: Secondary | ICD-10-CM | POA: Insufficient documentation

## 2014-08-16 DIAGNOSIS — Z79899 Other long term (current) drug therapy: Secondary | ICD-10-CM | POA: Insufficient documentation

## 2014-08-16 DIAGNOSIS — H409 Unspecified glaucoma: Secondary | ICD-10-CM | POA: Insufficient documentation

## 2014-08-16 DIAGNOSIS — F0390 Unspecified dementia without behavioral disturbance: Secondary | ICD-10-CM | POA: Diagnosis not present

## 2014-08-16 DIAGNOSIS — M1990 Unspecified osteoarthritis, unspecified site: Secondary | ICD-10-CM | POA: Insufficient documentation

## 2014-08-16 DIAGNOSIS — Z872 Personal history of diseases of the skin and subcutaneous tissue: Secondary | ICD-10-CM | POA: Insufficient documentation

## 2014-08-16 DIAGNOSIS — F419 Anxiety disorder, unspecified: Secondary | ICD-10-CM | POA: Insufficient documentation

## 2014-08-16 DIAGNOSIS — Z862 Personal history of diseases of the blood and blood-forming organs and certain disorders involving the immune mechanism: Secondary | ICD-10-CM | POA: Diagnosis not present

## 2014-08-16 DIAGNOSIS — Y92129 Unspecified place in nursing home as the place of occurrence of the external cause: Secondary | ICD-10-CM

## 2014-08-16 DIAGNOSIS — W19XXXA Unspecified fall, initial encounter: Secondary | ICD-10-CM

## 2014-08-16 NOTE — ED Notes (Signed)
Patient via [facility] from [facility] after patient was found lying in the floor. Staff reported to EMS that patient usually lays down in the floor to sleep, but they just wanted to make sure she didn't fall. Patient is sleeping upon arrival to ED and responds to stimuli, not complaining of pain anywhere.

## 2014-08-16 NOTE — ED Provider Notes (Signed)
CSN: [id number]     Arrival date & time 08/16/14  0307 History   First MD Initiated Contact with Patient 08/16/14 0355     Chief Complaint  Patient presents with  . Fall     (Consider location/radiation/quality/duration/timing/severity/associated sxs/prior Treatment) Patient is a 80 y.o. female presenting with fall. The history is provided by the nursing home. The history is limited by the condition of the patient (dementia).  Fall  She was reported to have been found lying on the floor and was not certain whether she had fallen her dislike down to go to sleep. Patient is not able to give any relevant history but is not complaining of anything.  Past Medical History  Diagnosis Date  . Hypertension   . Dementia   . Glaucoma   . Hip fracture, left   . Cellulitis   . Anemia   . Left leg DVT   . Anxiety   . Insomnia   . Arthritis   . Chronic ear infection   . 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 Hip fracture    Past Surgical History  Procedure Laterality Date  . Orif hip fracture  05/23/2011    Procedure: OPEN REDUCTION INTERNAL FIXATION HIP;  Surgeon: Fuller Canada, MD;  Location: [facility];  Service: Orthopedics;  Laterality: Left;  with Gamma Nail  . Hip arthrotomy    . Abdominal hysterectomy     History reviewed. No pertinent family history. History  Substance Use Topics  . Smoking status: Never Smoker   . Smokeless tobacco: Never Used  . Alcohol Use: No   OB History    Gravida Para Term Preterm AB TAB SAB Ectopic Multiple Living            0     Review of Systems  Unable to perform ROS: Dementia      Allergies  Review of patient's allergies indicates no known allergies.  Home Medications   Prior to Admission medications   Medication Sig Start Date End Date Taking? Authorizing Provider  aspirin EC 81 MG tablet Take 81 mg by mouth daily.   Yes Historical Provider, MD  bimatoprost (LUMIGAN) 0.01 % SOLN Place 1 drop into both eyes at  bedtime.   Yes Historical Provider, MD  citalopram (CELEXA) 10 MG tablet Take 10 mg by mouth daily.   Yes Historical Provider, MD  Cranberry 425 MG CAPS Take 425 mg by mouth daily.   Yes Historical Provider, MD  docusate sodium (COLACE) 100 MG capsule Take 100 mg by mouth daily.   Yes Historical Provider, MD  lisinopril (PRINIVIL,ZESTRIL) 5 MG tablet Take 5 mg by mouth daily.   Yes Historical Provider, MD  loratadine (CLARITIN) 10 MG tablet Take 10 mg by mouth daily.   Yes Historical Provider, MD  Multiple Vitamin (DAILY VITE PO) Take 1 tablet by mouth daily.   Yes Historical Provider, MD  OLANZapine (ZYPREXA) 5 MG tablet Take 5 mg by mouth at bedtime.   Yes Historical Provider, MD  Polyethyl Glycol-Propyl Glycol (SYSTANE OP) Place 1 drop into both eyes 4 (four) times daily.   Yes Historical Provider, MD  polyethylene glycol (MIRALAX / GLYCOLAX) packet Take 17 g by mouth daily.   Yes Historical Provider, MD  senna (SENOKOT) 8.6 MG TABS tablet Take 1 tablet by mouth daily.   Yes Historical Provider, MD  acetaminophen (TYLENOL) 500 MG tablet Take 1,000 mg by mouth every 4 (four) hours as needed (pain(no more  than 2 doses in 24 hours)).    Historical Provider, MD  ASPIRIN LOW DOSE 81 MG EC tablet TAKE 1 TABLET BY MOUTH ONCE DAILY. Patient not taking: Reported on 08/10/2014 09/30/13   Babs Sciara, MD  citalopram (CELEXA) 10 MG tablet TAKE (1) TABLET BY MOUTH EACH MORNING. Patient not taking: Reported on 08/10/2014    Merlyn Albert, MD  Cranberry 425 MG CAPS TAKE 1 CAPSULE BY MOUTH ONCE DAILY. Patient not taking: Reported on 08/10/2014 01/05/14   Babs Sciara, MD  GLYCERIN ADULT 2 G suppository INSERT 1 SUPPOSITORY RECTALLY ONCE DAILY AS NEEDED FOR BOWEL MOVEMENT. Patient not taking: Reported on 08/10/2014 04/04/14   Merlyn Albert, MD  lisinopril (PRINIVIL,ZESTRIL) 5 MG tablet TAKE 1 TABLET BY MOUTH EVERY MORNING. Patient not taking: Reported on 08/10/2014 01/03/14   Merlyn Albert, MD   loperamide (IMODIUM) 2 MG capsule Take 2 mg by mouth every 6 (six) hours as needed for diarrhea or loose stools.  06/14/13   Historical Provider, MD  LORazepam (ATIVAN) 1 MG tablet Take 0.5 tablets (0.5 mg total) by mouth every 8 (eight) hours as needed for anxiety. Patient not taking: Reported on 08/10/2014 07/23/13   Benny Lennert, MD  LUMIGAN 0.01 % SOLN INSTILL 1 DROP INTO EACH EYE AT BEDTIME. Patient not taking: Reported on 08/10/2014 02/17/14   Babs Sciara, MD  Multiple Vitamin (DAILY VITE) TABS TAKE 1 TABLET BY MOUTH ONCE DAILY. Patient not taking: Reported on 08/10/2014 05/29/14   Merlyn Albert, MD  polyethylene glycol powder (GLYCOLAX/MIRALAX) powder MIX 1 CAPFUL (17 G) IN 8 OUNCES OF JUICE/WATER AND DRINK ONCE DAILY. Patient not taking: Reported on 08/10/2014 06/22/13   Merlyn Albert, MD  Q-PAP 500 MG tablet TAKE 2 TABLETS BY MOUTH EVERY 4 HOURS AS NEEDED FOR PAIN. NO MORE THAN 2 DOSES IN 24 HOURS. Patient not taking: Reported on 08/10/2014 11/14/13   Merlyn Albert, MD  QC NASAL RELIEF SINUS 0.05 % nasal spray INHALE 2 SPRAYS INTO EACH NOSTRIL EVERY 6 HOURS AS NEEDED FOR NASAL IRRITATION. Patient not taking: Reported on 08/10/2014 09/30/13   Babs Sciara, MD  SENNALAX-S 8.6-50 MG per tablet TAKE 1 TABLET BY MOUTH ONCE DAILY AS NEEDED FOR CONSTIPATION. Patient not taking: Reported on 08/10/2014 04/11/14   Merlyn Albert, MD  STOOL SOFTENER 100 MG capsule TAKE 1 CAPSULE BY MOUTH ONCE DAILY. Patient not taking: Reported on 08/10/2014 10/31/13   Merlyn Albert, MD   BP 151/109 mmHg  Pulse 58  Resp 15  Ht 5\' 2"  (1.575 m)  Wt 135 lb (61.236 kg)  BMI 24.69 kg/m2  SpO2 100% Physical Exam  Nursing note and vitals reviewed.  80 year old female, resting comfortably and in no acute distress. Vital signs are significant for borderline bradycardia, and hypertension. Oxygen saturation is 100%, which is normal. Head is normocephalic and atraumatic. PERRLA, EOMI. Oropharynx is  clear. Neck is nontender without adenopathy or JVD. Back is nontender and there is no CVA tenderness. Lungs are clear without rales, wheezes, or rhonchi. Chest is nontender. Heart has regular rate and rhythm without murmur. Abdomen is soft, flat, nontender without masses or hepatosplenomegaly and peristalsis is normoactive. Extremities have no cyanosis or edema, full range of motion is present. Skin is warm and dry without rash. Neurologic: She is oriented to person but not place or time, cranial nerves are intact, there are no motor or sensory deficits.  ED Course  Procedures (including critical  care time)  Imaging Review Ct Head Wo Contrast  08/16/2014   CLINICAL DATA:  Patient found lying on the floor. Concern for unwitnessed fall.  EXAM: CT HEAD WITHOUT CONTRAST  CT CERVICAL SPINE WITHOUT CONTRAST  TECHNIQUE: Multidetector CT imaging of the head and cervical spine was performed following the standard protocol without intravenous contrast. Multiplanar CT image reconstructions of the cervical spine were also generated.  COMPARISON:  CT head 08/10/2014. CT head and cervical spine 06/14/2014.  FINDINGS: CT HEAD FINDINGS  Diffuse cerebral atrophy. Ventricular dilatation consistent with central atrophy. Low-attenuation changes in the deep white matter consistent with small vessel ischemia. No mass effect or midline shift. No abnormal extra-axial fluid collections. Gray-white matter junctions are distinct. Basal cisterns are not effaced. No evidence of acute intracranial hemorrhage. No depressed skull fractures. Visualized paranasal sinuses and mastoid air cells are not opacified.  CT CERVICAL SPINE FINDINGS  Diffuse bone demineralization. Diffuse degenerative change throughout the cervical spine and facet joints. Alignment is stable since previous study with slight anterior subluxations of C4 on C5 and C7 on T1. These are likely degenerative changes. Prominent degenerative changes at the C1-2 level.  C1-2 articulation appears intact. Normal alignment of the facet joints. No vertebral compression deformities. No prevertebral soft tissue swelling. No focal bone lesion or bone destruction. Bone cortex and trabecular architecture appear intact. Soft tissues are unremarkable.  IMPRESSION: No acute intracranial abnormalities. Chronic atrophy and small vessel ischemic changes.  Diffuse degenerative changes throughout the cervical spine. Alignment is stable since previous study. No displaced fractures are identified.   Electronically Signed   By: Burman NievesWilliam  Stevens M.D.   On: 08/16/2014 04:32   Ct Cervical Spine Wo Contrast  08/16/2014   CLINICAL DATA:  Patient found lying on the floor. Concern for unwitnessed fall.  EXAM: CT HEAD WITHOUT CONTRAST  CT CERVICAL SPINE WITHOUT CONTRAST  TECHNIQUE: Multidetector CT imaging of the head and cervical spine was performed following the standard protocol without intravenous contrast. Multiplanar CT image reconstructions of the cervical spine were also generated.  COMPARISON:  CT head 08/10/2014. CT head and cervical spine 06/14/2014.  FINDINGS: CT HEAD FINDINGS  Diffuse cerebral atrophy. Ventricular dilatation consistent with central atrophy. Low-attenuation changes in the deep white matter consistent with small vessel ischemia. No mass effect or midline shift. No abnormal extra-axial fluid collections. Gray-white matter junctions are distinct. Basal cisterns are not effaced. No evidence of acute intracranial hemorrhage. No depressed skull fractures. Visualized paranasal sinuses and mastoid air cells are not opacified.  CT CERVICAL SPINE FINDINGS  Diffuse bone demineralization. Diffuse degenerative change throughout the cervical spine and facet joints. Alignment is stable since previous study with slight anterior subluxations of C4 on C5 and C7 on T1. These are likely degenerative changes. Prominent degenerative changes at the C1-2 level. C1-2 articulation appears intact.  Normal alignment of the facet joints. No vertebral compression deformities. No prevertebral soft tissue swelling. No focal bone lesion or bone destruction. Bone cortex and trabecular architecture appear intact. Soft tissues are unremarkable.  IMPRESSION: No acute intracranial abnormalities. Chronic atrophy and small vessel ischemic changes.  Diffuse degenerative changes throughout the cervical spine. Alignment is stable since previous study. No displaced fractures are identified.   Electronically Signed   By: Burman NievesWilliam  Stevens M.D.   On: 08/16/2014 04:32     MDM   Final diagnoses:  Fall at nursing home, initial encounter    Possible fall. Because of inability to get good history, she will be sent for CT  of head and cervical spine July occult injury.  CT scans are unremarkable. She is discharged back to her nursing home.  Dione Boozeavid Melroy Bougher, MD 08/16/14 [phone]

## 2014-08-16 NOTE — Discharge Instructions (Signed)

## 2014-10-04 ENCOUNTER — Emergency Department (HOSPITAL_COMMUNITY)
Admission: EM | Admit: 2014-10-04 | Discharge: 2014-10-04 | Disposition: A | Discharge status: Home / Self Care | Payer: Medicare Other | Attending: Emergency Medicine | Admitting: Emergency Medicine

## 2014-10-04 ENCOUNTER — Encounter (HOSPITAL_COMMUNITY): Payer: Self-pay

## 2014-10-04 ENCOUNTER — Emergency Department (HOSPITAL_COMMUNITY): Payer: Medicare Other

## 2014-10-04 DIAGNOSIS — W19XXXA Unspecified fall, initial encounter: Secondary | ICD-10-CM

## 2014-10-04 DIAGNOSIS — Z043 Encounter for examination and observation following other accident: Secondary | ICD-10-CM | POA: Diagnosis not present

## 2014-10-04 DIAGNOSIS — W1839XA Other fall on same level, initial encounter: Secondary | ICD-10-CM | POA: Diagnosis not present

## 2014-10-04 DIAGNOSIS — F419 Anxiety disorder, unspecified: Secondary | ICD-10-CM | POA: Insufficient documentation

## 2014-10-04 DIAGNOSIS — Y998 Other external cause status: Secondary | ICD-10-CM | POA: Diagnosis not present

## 2014-10-04 DIAGNOSIS — Y929 Unspecified place or not applicable: Secondary | ICD-10-CM | POA: Insufficient documentation

## 2014-10-04 DIAGNOSIS — Z872 Personal history of diseases of the skin and subcutaneous tissue: Secondary | ICD-10-CM | POA: Insufficient documentation

## 2014-10-04 DIAGNOSIS — Z7982 Long term (current) use of aspirin: Secondary | ICD-10-CM | POA: Insufficient documentation

## 2014-10-04 DIAGNOSIS — Z862 Personal history of diseases of the blood and blood-forming organs and certain disorders involving the immune mechanism: Secondary | ICD-10-CM | POA: Insufficient documentation

## 2014-10-04 DIAGNOSIS — H409 Unspecified glaucoma: Secondary | ICD-10-CM | POA: Insufficient documentation

## 2014-10-04 DIAGNOSIS — Z8781 Personal history of (healed) traumatic fracture: Secondary | ICD-10-CM | POA: Diagnosis not present

## 2014-10-04 DIAGNOSIS — Z79899 Other long term (current) drug therapy: Secondary | ICD-10-CM | POA: Insufficient documentation

## 2014-10-04 DIAGNOSIS — Z87718 Personal history of other specified (corrected) congenital malformations of genitourinary system: Secondary | ICD-10-CM | POA: Diagnosis not present

## 2014-10-04 DIAGNOSIS — M1990 Unspecified osteoarthritis, unspecified site: Secondary | ICD-10-CM | POA: Insufficient documentation

## 2014-10-04 DIAGNOSIS — Y939 Activity, unspecified: Secondary | ICD-10-CM | POA: Insufficient documentation

## 2014-10-04 DIAGNOSIS — F0390 Unspecified dementia without behavioral disturbance: Secondary | ICD-10-CM | POA: Insufficient documentation

## 2014-10-04 DIAGNOSIS — I10 Essential (primary) hypertension: Secondary | ICD-10-CM | POA: Insufficient documentation

## 2014-10-04 NOTE — Discharge Instructions (Signed)
X-ray of pelvis shows no fractures. Follow-up with primary care doctor

## 2014-10-04 NOTE — ED Notes (Signed)
EDP in room with pt on arrival to ED. Pt removed from backboard by EDP

## 2014-10-04 NOTE — ED Provider Notes (Signed)
CSN: [id number]     Arrival date & time 10/04/14  4098 History  This chart was scribed for Donnetta Hutching, MD, by Ronney Lion, ED Scribe. This patient was seen in room APA18/APA18 and the patient's care was started at 7:40 AM.    Chief Complaint  Patient presents with  . Fall   The history is provided by the EMS personnel. The history is limited by the condition of the patient. No language interpreter was used.     LEVEL 5 CAVEAT DUE TO DEMENTIA  HPI Comments: Wendy Coleman is a 81 y.o. female brought in by ambulance, who presents to the Emergency Department for evaluation of a mechanical fall fall. Per EMS, she is from [facility] home and fell while using her walker. She has no complaints of head, neck, truncal, extremity pain. Sitter reports normal behavior.  Past Medical History  Diagnosis Date  . Hypertension   . Dementia   . Glaucoma   . Hip fracture, left   . Cellulitis   . Anemia   . Left leg DVT   . Anxiety   . Insomnia   . Arthritis   . Chronic ear infection   . 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 Hip fracture    Past Surgical History  Procedure Laterality Date  . Orif hip fracture  05/23/2011    Procedure: OPEN REDUCTION INTERNAL FIXATION HIP;  Surgeon: Fuller Canada, MD;  Location: [facility];  Service: Orthopedics;  Laterality: Left;  with Gamma Nail  . Hip arthrotomy    . Abdominal hysterectomy     No family history on file. History  Substance Use Topics  . Smoking status: Never Smoker   . Smokeless tobacco: Never Used  . Alcohol Use: No   OB History    Gravida Para Term Preterm AB TAB SAB Ectopic Multiple Living            0     Review of Systems  Unable to perform ROS: Dementia      Allergies  Review of patient's allergies indicates no known allergies.  Home Medications   Prior to Admission medications   Medication Sig Start Date End Date Taking? Authorizing Provider  acetaminophen (TYLENOL) 500 MG tablet Take  1,000 mg by mouth every 4 (four) hours as needed (pain(no more than 2 doses in 24 hours)).    Historical Provider, MD  aspirin EC 81 MG tablet Take 81 mg by mouth daily.    Historical Provider, MD  ASPIRIN LOW DOSE 81 MG EC tablet TAKE 1 TABLET BY MOUTH ONCE DAILY. Patient not taking: Reported on 08/10/2014 09/30/13   Babs Sciara, MD  bimatoprost (LUMIGAN) 0.01 % SOLN Place 1 drop into both eyes at bedtime.    Historical Provider, MD  citalopram (CELEXA) 10 MG tablet TAKE (1) TABLET BY MOUTH EACH MORNING. Patient not taking: Reported on 08/10/2014    Merlyn Albert, MD  citalopram (CELEXA) 10 MG tablet Take 10 mg by mouth daily.    Historical Provider, MD  Cranberry 425 MG CAPS TAKE 1 CAPSULE BY MOUTH ONCE DAILY. Patient not taking: Reported on 08/10/2014 01/05/14   Babs Sciara, MD  Cranberry 425 MG CAPS Take 425 mg by mouth daily.    Historical Provider, MD  docusate sodium (COLACE) 100 MG capsule Take 100 mg by mouth daily.    Historical Provider, MD  GLYCERIN ADULT 2 G suppository INSERT 1 SUPPOSITORY RECTALLY ONCE DAILY  AS NEEDED FOR BOWEL MOVEMENT. Patient not taking: Reported on 08/10/2014 04/04/14   Merlyn Albert, MD  lisinopril (PRINIVIL,ZESTRIL) 5 MG tablet TAKE 1 TABLET BY MOUTH EVERY MORNING. Patient not taking: Reported on 08/10/2014 01/03/14   Merlyn Albert, MD  lisinopril (PRINIVIL,ZESTRIL) 5 MG tablet Take 5 mg by mouth daily.    Historical Provider, MD  loperamide (IMODIUM) 2 MG capsule Take 2 mg by mouth every 6 (six) hours as needed for diarrhea or loose stools.  06/14/13   Historical Provider, MD  loratadine (CLARITIN) 10 MG tablet Take 10 mg by mouth daily.    Historical Provider, MD  LORazepam (ATIVAN) 1 MG tablet Take 0.5 tablets (0.5 mg total) by mouth every 8 (eight) hours as needed for anxiety. Patient not taking: Reported on 08/10/2014 07/23/13   Benny Lennert, MD  LUMIGAN 0.01 % SOLN INSTILL 1 DROP INTO EACH EYE AT BEDTIME. Patient not taking: Reported on  08/10/2014 02/17/14   Babs Sciara, MD  Multiple Vitamin (DAILY VITE PO) Take 1 tablet by mouth daily.    Historical Provider, MD  Multiple Vitamin (DAILY VITE) TABS TAKE 1 TABLET BY MOUTH ONCE DAILY. Patient not taking: Reported on 08/10/2014 05/29/14   Merlyn Albert, MD  OLANZapine (ZYPREXA) 5 MG tablet Take 5 mg by mouth at bedtime.    Historical Provider, MD  Polyethyl Glycol-Propyl Glycol (SYSTANE OP) Place 1 drop into both eyes 4 (four) times daily.    Historical Provider, MD  polyethylene glycol (MIRALAX / GLYCOLAX) packet Take 17 g by mouth daily.    Historical Provider, MD  polyethylene glycol powder (GLYCOLAX/MIRALAX) powder MIX 1 CAPFUL (17 G) IN 8 OUNCES OF JUICE/WATER AND DRINK ONCE DAILY. Patient not taking: Reported on 08/10/2014 06/22/13   Merlyn Albert, MD  Q-PAP 500 MG tablet TAKE 2 TABLETS BY MOUTH EVERY 4 HOURS AS NEEDED FOR PAIN. NO MORE THAN 2 DOSES IN 24 HOURS. Patient not taking: Reported on 08/10/2014 11/14/13   Merlyn Albert, MD  QC NASAL RELIEF SINUS 0.05 % nasal spray INHALE 2 SPRAYS INTO EACH NOSTRIL EVERY 6 HOURS AS NEEDED FOR NASAL IRRITATION. Patient not taking: Reported on 08/10/2014 09/30/13   Babs Sciara, MD  senna (SENOKOT) 8.6 MG TABS tablet Take 1 tablet by mouth daily.    Historical Provider, MD  SENNALAX-S 8.6-50 MG per tablet TAKE 1 TABLET BY MOUTH ONCE DAILY AS NEEDED FOR CONSTIPATION. Patient not taking: Reported on 08/10/2014 04/11/14   Merlyn Albert, MD  STOOL SOFTENER 100 MG capsule TAKE 1 CAPSULE BY MOUTH ONCE DAILY. Patient not taking: Reported on 08/10/2014 10/31/13   Merlyn Albert, MD   BP 150/73 mmHg  Pulse 63  Temp(Src) 97.7 F (36.5 C) (Oral)  Resp 18  SpO2 98% Physical Exam  HENT:  Head: Normocephalic and atraumatic.  Eyes: Conjunctivae and EOM are normal. Pupils are equal, round, and reactive to light.  Neck: Neck supple.  Cardiovascular: Normal rate and regular rhythm.   Pulmonary/Chest: Effort normal and breath sounds  normal.  Abdominal: Soft. Bowel sounds are normal.  Musculoskeletal: Normal range of motion.  Neurological: She is alert.  Skin: Skin is warm and dry.  Nursing note and vitals reviewed.   ED Course  Procedures (including critical care time)  DIAGNOSTIC STUDIES: Oxygen Saturation is 98% on room air, normal by my interpretation.     MDM   Final diagnoses:  Fall   Sitter reports normal behavior. No bony tenderness. No head  or neck trauma. Plain films of pelvis negative for fracture  I personally performed the services described in this documentation, which was scribed in my presence. The recorded information has been reviewed and is accurate.    Donnetta HutchingBrian Zerrick Hanssen, MD 10/04/14 1051

## 2014-10-04 NOTE — ED Notes (Signed)
[facility]

## 2014-10-04 NOTE — ED Notes (Signed)
[facility] with [facility] to [facility]

## 2014-12-09 ENCOUNTER — Encounter (HOSPITAL_COMMUNITY): Payer: Self-pay | Admitting: Emergency Medicine

## 2014-12-09 ENCOUNTER — Emergency Department (HOSPITAL_COMMUNITY)
Admission: EM | Admit: 2014-12-09 | Discharge: 2014-12-09 | Disposition: A | Discharge status: Home / Self Care | Payer: Medicare Other | Attending: Emergency Medicine | Admitting: Emergency Medicine

## 2014-12-09 ENCOUNTER — Emergency Department (HOSPITAL_COMMUNITY): Payer: Medicare Other

## 2014-12-09 DIAGNOSIS — Z79899 Other long term (current) drug therapy: Secondary | ICD-10-CM | POA: Diagnosis not present

## 2014-12-09 DIAGNOSIS — Z7982 Long term (current) use of aspirin: Secondary | ICD-10-CM | POA: Insufficient documentation

## 2014-12-09 DIAGNOSIS — R011 Cardiac murmur, unspecified: Secondary | ICD-10-CM | POA: Diagnosis not present

## 2014-12-09 DIAGNOSIS — Z8669 Personal history of other diseases of the nervous system and sense organs: Secondary | ICD-10-CM | POA: Diagnosis not present

## 2014-12-09 DIAGNOSIS — N179 Acute kidney failure, unspecified: Secondary | ICD-10-CM | POA: Diagnosis not present

## 2014-12-09 DIAGNOSIS — I10 Essential (primary) hypertension: Secondary | ICD-10-CM | POA: Insufficient documentation

## 2014-12-09 DIAGNOSIS — Z86718 Personal history of other venous thrombosis and embolism: Secondary | ICD-10-CM | POA: Insufficient documentation

## 2014-12-09 DIAGNOSIS — Z8781 Personal history of (healed) traumatic fracture: Secondary | ICD-10-CM | POA: Diagnosis not present

## 2014-12-09 DIAGNOSIS — Z872 Personal history of diseases of the skin and subcutaneous tissue: Secondary | ICD-10-CM | POA: Diagnosis not present

## 2014-12-09 DIAGNOSIS — R112 Nausea with vomiting, unspecified: Secondary | ICD-10-CM | POA: Diagnosis not present

## 2014-12-09 DIAGNOSIS — M1990 Unspecified osteoarthritis, unspecified site: Secondary | ICD-10-CM | POA: Insufficient documentation

## 2014-12-09 DIAGNOSIS — F419 Anxiety disorder, unspecified: Secondary | ICD-10-CM | POA: Diagnosis not present

## 2014-12-09 DIAGNOSIS — E860 Dehydration: Secondary | ICD-10-CM | POA: Insufficient documentation

## 2014-12-09 DIAGNOSIS — R197 Diarrhea, unspecified: Secondary | ICD-10-CM | POA: Insufficient documentation

## 2014-12-09 DIAGNOSIS — F0390 Unspecified dementia without behavioral disturbance: Secondary | ICD-10-CM | POA: Insufficient documentation

## 2014-12-09 DIAGNOSIS — R627 Adult failure to thrive: Secondary | ICD-10-CM | POA: Diagnosis present

## 2014-12-09 LAB — LACTIC ACID, PLASMA: Lactic Acid, Venous: 1.4 mmol/L (ref 0.5–2.0)

## 2014-12-09 LAB — URINE MICROSCOPIC-ADD ON

## 2014-12-09 LAB — BASIC METABOLIC PANEL
Anion gap: 6 (ref 5–15)
BUN: 28 mg/dL — ABNORMAL HIGH (ref 6–23)
CALCIUM: 8.3 mg/dL — AB (ref 8.4–10.5)
CO2: 23 mmol/L (ref 19–32)
Chloride: 111 mmol/L (ref 96–112)
Creatinine, Ser: 1.49 mg/dL — ABNORMAL HIGH (ref 0.50–1.10)
GFR calc non Af Amer: 27 mL/min — ABNORMAL LOW (ref >90)
GFR, EST AFRICAN AMERICAN: 32 mL/min — AB (ref >90)
GLUCOSE: 117 mg/dL — AB (ref 70–99)
POTASSIUM: 4.2 mmol/L (ref 3.5–5.1)
Sodium: 140 mmol/L (ref 135–145)

## 2014-12-09 LAB — CBC WITH DIFFERENTIAL/PLATELET
BASOS PCT: 0 % (ref 0–1)
Basophils Absolute: 0 10*3/uL (ref 0.0–0.1)
Eosinophils Absolute: 0 10*3/uL (ref 0.0–0.7)
Eosinophils Relative: 0 % (ref 0–5)
HEMATOCRIT: 29.2 % — AB (ref 36.0–46.0)
HEMOGLOBIN: 9.6 g/dL — AB (ref 12.0–15.0)
LYMPHS PCT: 10 % — AB (ref 12–46)
Lymphs Abs: 1.1 10*3/uL (ref 0.7–4.0)
MCH: 32.8 pg (ref 26.0–34.0)
MCHC: 32.9 g/dL (ref 30.0–36.0)
MCV: 99.7 fL (ref 78.0–100.0)
MONOS PCT: 5 % (ref 3–12)
Monocytes Absolute: 0.5 10*3/uL (ref 0.1–1.0)
NEUTROS ABS: 9.9 10*3/uL — AB (ref 1.7–7.7)
Neutrophils Relative %: 85 % — ABNORMAL HIGH (ref 43–77)
Platelets: 224 10*3/uL (ref 150–400)
RBC: 2.93 MIL/uL — AB (ref 3.87–5.11)
RDW: 14.9 % (ref 11.5–15.5)
WBC: 11.6 10*3/uL — AB (ref 4.0–10.5)

## 2014-12-09 LAB — URINALYSIS, ROUTINE W REFLEX MICROSCOPIC
Bilirubin Urine: NEGATIVE
Glucose, UA: NEGATIVE mg/dL
Ketones, ur: NEGATIVE mg/dL
Nitrite: NEGATIVE
PROTEIN: NEGATIVE mg/dL
Urobilinogen, UA: 0.2 mg/dL (ref 0.0–1.0)
pH: 5.5 (ref 5.0–8.0)

## 2014-12-09 LAB — TSH: TSH: 1.393 u[IU]/mL (ref 0.350–4.500)

## 2014-12-09 MED ORDER — SODIUM CHLORIDE 0.9 % IV BOLUS (SEPSIS)
1000.0000 mL | Freq: Once | INTRAVENOUS | Status: AC
  Administered 2014-12-09: 1000 mL via INTRAVENOUS

## 2014-12-09 MED ORDER — ONDANSETRON HCL 4 MG/2ML IJ SOLN
4.0000 mg | Freq: Once | INTRAMUSCULAR | Status: AC
  Administered 2014-12-09: 4 mg via INTRAVENOUS
  Filled 2014-12-09: qty 2

## 2014-12-09 MED ORDER — ONDANSETRON 4 MG PO TBDP: ORAL_TABLET | ORAL | Status: AC

## 2014-12-09 NOTE — ED Provider Notes (Signed)
CSN: [id number]     Arrival date & time 12/09/14  1905 History  This chart was scribed for Blane Ohara, MD by Tonye Royalty, ED Scribe. This patient was seen in room APAH2/APAH2 and the patient's care was started at 7:44 PM.    Chief Complaint  Patient presents with  . Failure To Thrive   Patient is a 81 y.o. female presenting with general illness. The history is provided by the patient and a caregiver. No language interpreter was used.  Illness Location:  Generalized Quality:  Decreased PO intake Severity:  Moderate Onset quality:  Gradual Duration:  1 week Timing:  Constant Progression:  Worsening Chronicity:  New Associated symptoms: diarrhea and fatigue   Associated symptoms: no chest pain, no cough, no fever, no shortness of breath and no vomiting     HPI Comments: Wendy Coleman is a 81 y.o. female resident at [facility] who presents to the Emergency Department complaining of decreased appetite and generalized weakness with onset 1 week ago. Per sitter, she has not been eating for the past week and has had decreased fluid intake as well, worsening progressively. She states the patient also had an episode of bowel incontinence today with diarrhea. Caretaker states she does not have any medical problems. She denies vomiting, fever, or chills. Patient denies chest pain, other pain, cough, or SOB.   Past Medical History  Diagnosis Date  . Hypertension   . Dementia   . Glaucoma   . Hip fracture, left   . Cellulitis   . Anemia   . Left leg DVT   . Anxiety   . Insomnia   . Arthritis   . Chronic ear infection   . 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 Hip fracture    Past Surgical History  Procedure Laterality Date  . Orif hip fracture  05/23/2011    Procedure: OPEN REDUCTION INTERNAL FIXATION HIP;  Surgeon: Fuller Canada, MD;  Location: [facility];  Service: Orthopedics;  Laterality: Left;  with Gamma Nail  . Hip arthrotomy    . Abdominal  hysterectomy     No family history on file. History  Substance Use Topics  . Smoking status: Never Smoker   . Smokeless tobacco: Never Used  . Alcohol Use: No   OB History    Gravida Para Term Preterm AB TAB SAB Ectopic Multiple Living            0     Review of Systems  Constitutional: Positive for appetite change and fatigue. Negative for fever and chills.  Respiratory: Negative for cough and shortness of breath.   Cardiovascular: Negative for chest pain.  Gastrointestinal: Positive for diarrhea. Negative for vomiting.  Neurological: Positive for weakness.  All other systems reviewed and are negative.     Allergies  Review of patient's allergies indicates no known allergies.  Home Medications   Prior to Admission medications   Medication Sig Start Date End Date Taking? Authorizing Provider  aspirin EC 81 MG tablet Take 81 mg by mouth daily.   Yes Historical Provider, MD  bimatoprost (LUMIGAN) 0.01 % SOLN Place 1 drop into both eyes at bedtime.   Yes Historical Provider, MD  citalopram (CELEXA) 10 MG tablet Take 10 mg by mouth daily.   Yes Historical Provider, MD  Cranberry 425 MG CAPS Take 425 mg by mouth daily.   Yes Historical Provider, MD  docusate sodium (COLACE) 100 MG capsule Take 100 mg by  mouth daily.   Yes Historical Provider, MD  lisinopril (PRINIVIL,ZESTRIL) 5 MG tablet Take 5 mg by mouth daily.   Yes Historical Provider, MD  loratadine (CLARITIN) 10 MG tablet Take 10 mg by mouth daily.   Yes Historical Provider, MD  Multiple Vitamin (DAILY VITE PO) Take 1 tablet by mouth daily.   Yes Historical Provider, MD  OLANZapine (ZYPREXA) 2.5 MG tablet Take 2.5 mg by mouth at bedtime.   Yes Historical Provider, MD  Polyethyl Glycol-Propyl Glycol (SYSTANE OP) Place 1 drop into both eyes 4 (four) times daily.   Yes Historical Provider, MD  polyethylene glycol (MIRALAX / GLYCOLAX) packet Take 17 g by mouth daily.   Yes Historical Provider, MD  senna (SENOKOT) 8.6 MG TABS  tablet Take 1 tablet by mouth daily.   Yes Historical Provider, MD  ASPIRIN LOW DOSE 81 MG EC tablet TAKE 1 TABLET BY MOUTH ONCE DAILY. Patient not taking: Reported on 08/10/2014 09/30/13   Babs SciaraScott A Luking, MD  citalopram (CELEXA) 10 MG tablet TAKE (1) TABLET BY MOUTH EACH MORNING. Patient not taking: Reported on 08/10/2014    Merlyn AlbertWilliam S Luking, MD  Cranberry 425 MG CAPS TAKE 1 CAPSULE BY MOUTH ONCE DAILY. Patient not taking: Reported on 08/10/2014 01/05/14   Babs SciaraScott A Luking, MD  GLYCERIN ADULT 2 G suppository INSERT 1 SUPPOSITORY RECTALLY ONCE DAILY AS NEEDED FOR BOWEL MOVEMENT. Patient not taking: Reported on 08/10/2014 04/04/14   Merlyn AlbertWilliam S Luking, MD  lisinopril (PRINIVIL,ZESTRIL) 5 MG tablet TAKE 1 TABLET BY MOUTH EVERY MORNING. Patient not taking: Reported on 08/10/2014 01/03/14   Merlyn AlbertWilliam S Luking, MD  LORazepam (ATIVAN) 1 MG tablet Take 0.5 tablets (0.5 mg total) by mouth every 8 (eight) hours as needed for anxiety. Patient not taking: Reported on 08/10/2014 07/23/13   Bethann BerkshireJoseph Zammit, MD  LUMIGAN 0.01 % SOLN INSTILL 1 DROP INTO EACH EYE AT BEDTIME. Patient not taking: Reported on 08/10/2014 02/17/14   Babs SciaraScott A Luking, MD  Multiple Vitamin (DAILY VITE) TABS TAKE 1 TABLET BY MOUTH ONCE DAILY. Patient not taking: Reported on 08/10/2014 05/29/14   Merlyn AlbertWilliam S Luking, MD  OLANZapine (ZYPREXA) 5 MG tablet Take 5 mg by mouth at bedtime.    Historical Provider, MD  ondansetron (ZOFRAN ODT) 4 MG disintegrating tablet 4mg  ODT q4 hours prn nausea/vomit 12/09/14   Blane OharaJoshua Laurali Goddard, MD  polyethylene glycol powder (GLYCOLAX/MIRALAX) powder MIX 1 CAPFUL (17 G) IN 8 OUNCES OF JUICE/WATER AND DRINK ONCE DAILY. Patient not taking: Reported on 08/10/2014 06/22/13   Merlyn AlbertWilliam S Luking, MD  Q-PAP 500 MG tablet TAKE 2 TABLETS BY MOUTH EVERY 4 HOURS AS NEEDED FOR PAIN. NO MORE THAN 2 DOSES IN 24 HOURS. Patient not taking: Reported on 08/10/2014 11/14/13   Merlyn AlbertWilliam S Luking, MD  QC NASAL RELIEF SINUS 0.05 % nasal spray INHALE 2 SPRAYS  INTO EACH NOSTRIL EVERY 6 HOURS AS NEEDED FOR NASAL IRRITATION. Patient not taking: Reported on 08/10/2014 09/30/13   Babs SciaraScott A Luking, MD  SENNALAX-S 8.6-50 MG per tablet TAKE 1 TABLET BY MOUTH ONCE DAILY AS NEEDED FOR CONSTIPATION. Patient not taking: Reported on 08/10/2014 04/11/14   Merlyn AlbertWilliam S Luking, MD  STOOL SOFTENER 100 MG capsule TAKE 1 CAPSULE BY MOUTH ONCE DAILY. Patient not taking: Reported on 08/10/2014 10/31/13   Merlyn AlbertWilliam S Luking, MD   BP 103/41 mmHg  Pulse 72  Temp(Src) 97.7 F (36.5 C) (Oral)  Resp 16  Ht 5\' 1"  (1.549 m)  SpO2 96% Physical Exam  Constitutional: She is oriented  to person, place, and time. She appears well-developed and well-nourished.  HENT:  Head: Normocephalic and atraumatic.  Mildly dry mucous membranes  Eyes: Conjunctivae and EOM are normal. Pupils are equal, round, and reactive to light.  Neck: Normal range of motion. Neck supple.  Cardiovascular: Normal rate and regular rhythm.   Murmur (syscolic murmur upper sternal border) heard. Pulmonary/Chest: Effort normal. No respiratory distress. She has no wheezes. She has rales (few sparse crackles).  Abdominal: Soft. She exhibits no distension. There is no guarding.  Mild epigastric discomfort  Musculoskeletal: Normal range of motion. She exhibits no edema (no significant swelling to legs).  Neurological: She is alert and oriented to person, place, and time.  Skin: Skin is warm and dry.  Psychiatric: She has a normal mood and affect.  Nursing note and vitals reviewed.   ED Course  Procedures (including critical care time)  DIAGNOSTIC STUDIES: Oxygen Saturation is 96% on room air, adequate by my interpretation.    COORDINATION OF CARE: 7:49 PM Discussed treatment plan with patient at beside, the patient agrees with the plan and has no further questions at this time.   Labs Review Labs Reviewed  BASIC METABOLIC PANEL - Abnormal; Notable for the following:    Glucose, Bld 117 (*)    BUN 28 (*)     Creatinine, Ser 1.49 (*)    Calcium 8.3 (*)    GFR calc non Af Amer 27 (*)    GFR calc Af Amer 32 (*)    All other components within normal limits  CBC WITH DIFFERENTIAL/PLATELET - Abnormal; Notable for the following:    WBC 11.6 (*)    RBC 2.93 (*)    Hemoglobin 9.6 (*)    HCT 29.2 (*)    Neutrophils Relative % 85 (*)    Neutro Abs 9.9 (*)    Lymphocytes Relative 10 (*)    All other components within normal limits  URINALYSIS, ROUTINE W REFLEX MICROSCOPIC - Abnormal; Notable for the following:    APPearance HAZY (*)    Specific Gravity, Urine >1.030 (*)    Hgb urine dipstick TRACE (*)    Leukocytes, UA TRACE (*)    All other components within normal limits  URINE MICROSCOPIC-ADD ON - Abnormal; Notable for the following:    Squamous Epithelial / LPF FEW (*)    Bacteria, UA MANY (*)    All other components within normal limits  URINE CULTURE  LACTIC ACID, PLASMA  TSH    Imaging Review Dg Chest 1 View  12/09/2014   CLINICAL DATA:  81 year old patient with acute onset of vomiting. Recent failure To thrive.  EXAM: CHEST  1 VIEW  COMPARISON:  None.  FINDINGS: Suboptimal inspiration accounts for crowded bronchovascular markings, especially in the bases, and accentuates the cardiac silhouette. Atelectasis taking this into account, cardiac silhouette moderately enlarged but stable. Lungs otherwise clear. No localized airspace consolidation. No pleural effusions. No pneumothorax. Normal pulmonary vascularity. Severe degenerative changes involving both shoulder joints. Generalized osseous demineralization.  IMPRESSION: 1. Suboptimal inspiration accounts for bibasilar atelectasis. No acute cardiopulmonary disease otherwise. 2. Stable moderate cardiomegaly without pulmonary edema.   Electronically Signed   By: Hulan Saas M.D.   On: 12/09/2014 20:54     EKG Interpretation None      MDM   Final diagnoses:  Vomiting with nausea, not intractable  Diarrhea  Dehydration  Acute renal  failure, unspecified acute renal failure type   I personally performed the services described in this documentation,  which was scribed in my presence. The recorded information has been reviewed and is accurate.  Patient presents with decreased oral intake the past 4-5 days and not as active with recurrent diarrhea today.  Especially for age patient overall well-appearing and clinically dehydrated dry mucous membranes. Kidney function and BUN elevated. IV fluids given, oral fluids given patient tolerated. Patient has significant support at the senior living facility. Caretakers comfortable taking her home with close outpatient follow-up.  Results and differential diagnosis were discussed with the patient/parent/guardian. Close follow up outpatient was discussed, comfortable with the plan.   Medications  ondansetron (ZOFRAN) injection 4 mg (4 mg Intravenous Given 12/09/14 2000)  sodium chloride 0.9 % bolus 1,000 mL (1,000 mLs Intravenous New Bag/Given 12/09/14 2001)    Filed Vitals:   12/09/14 1915  BP: 103/41  Pulse: 72  Temp: 97.7 F (36.5 C)  TempSrc: Oral  Resp: 16  Height:  (1.549 m)  SpO2: 96%    Final diagnoses:  Vomiting with nausea, not intractable  Diarrhea  Dehydration  Acute renal failure, unspecified acute renal failure type      Blane Ohara, MD 12/18/14 [phone]

## 2014-12-09 NOTE — ED Notes (Signed)
Per EMS: [facility] is a [facility]. Pt has not been eating or drinking for the past week and has been more "lethargic than normal today". Pt also had episode of diarrhea today.

## 2014-12-09 NOTE — Discharge Instructions (Signed)
If you were given medicines take as directed.  If you are on coumadin or contraceptives realize their levels and effectiveness is altered by many different medicines.  If you have any reaction (rash, tongues swelling, other) to the medicines stop taking and see a physician.  Follow-up with your physician for reassessment early this week and discuss rechecking kidney function. Stay well-hydrated, take Zofran for nausea.   Please follow up as directed and return to the ER or see a physician for new or worsening symptoms.  Thank you. Filed Vitals:   12/09/14 1915  BP: 103/41  Pulse: 72  Temp: 97.7 F (36.5 C)  TempSrc: Oral  Resp: 16  Height: 5\' 1"  (1.549 m)  SpO2: 96%

## 2014-12-11 LAB — URINE CULTURE
CULTURE: NO GROWTH
Colony Count: NO GROWTH

## 2014-12-16 ENCOUNTER — Emergency Department (HOSPITAL_COMMUNITY)
Admission: EM | Admit: 2014-12-16 | Discharge: 2014-12-16 | Disposition: A | Discharge status: Home / Self Care | Payer: Medicare Other | Attending: Emergency Medicine | Admitting: Emergency Medicine

## 2014-12-16 ENCOUNTER — Encounter (HOSPITAL_COMMUNITY): Payer: Self-pay | Admitting: Cardiology

## 2014-12-16 DIAGNOSIS — Z7982 Long term (current) use of aspirin: Secondary | ICD-10-CM | POA: Diagnosis not present

## 2014-12-16 DIAGNOSIS — Z862 Personal history of diseases of the blood and blood-forming organs and certain disorders involving the immune mechanism: Secondary | ICD-10-CM | POA: Diagnosis not present

## 2014-12-16 DIAGNOSIS — I10 Essential (primary) hypertension: Secondary | ICD-10-CM | POA: Diagnosis not present

## 2014-12-16 DIAGNOSIS — F0390 Unspecified dementia without behavioral disturbance: Secondary | ICD-10-CM | POA: Diagnosis not present

## 2014-12-16 DIAGNOSIS — Z8669 Personal history of other diseases of the nervous system and sense organs: Secondary | ICD-10-CM | POA: Insufficient documentation

## 2014-12-16 DIAGNOSIS — Z8781 Personal history of (healed) traumatic fracture: Secondary | ICD-10-CM | POA: Diagnosis not present

## 2014-12-16 DIAGNOSIS — Z86718 Personal history of other venous thrombosis and embolism: Secondary | ICD-10-CM | POA: Insufficient documentation

## 2014-12-16 DIAGNOSIS — F419 Anxiety disorder, unspecified: Secondary | ICD-10-CM | POA: Diagnosis not present

## 2014-12-16 DIAGNOSIS — Z79899 Other long term (current) drug therapy: Secondary | ICD-10-CM | POA: Diagnosis not present

## 2014-12-16 DIAGNOSIS — R040 Epistaxis: Secondary | ICD-10-CM | POA: Insufficient documentation

## 2014-12-16 DIAGNOSIS — M1990 Unspecified osteoarthritis, unspecified site: Secondary | ICD-10-CM | POA: Diagnosis not present

## 2014-12-16 NOTE — ED Notes (Addendum)
Nosebleed onset at 645am.  [facility].   No nosebleed upon arrival.   Treated for same 6 months ago.

## 2014-12-16 NOTE — ED Notes (Signed)
Report called to Marylene LandAngela, LPN at [facility]. Awaiting transport back to facility.

## 2014-12-16 NOTE — ED Provider Notes (Signed)
CSN: [id number]     Arrival date & time 12/16/14  0740 History   First MD Initiated Contact with Patient 12/16/14 [phone]     Chief Complaint  Patient presents with  . Epistaxis      Patient is a [age] y.o. female presenting with nosebleeds. The history is provided by the EMS personnel, the nursing home, a relative and the patient. The history is limited by the condition of the patient (Hx dementia).  Epistaxis Pt was seen at 0750.  Per EMS and NH report: c/o sudden onset and resolution of one episode of epistaxis that began approximately 0645 PTA. Pt's daughter states pt often "stuffs tissues up her nose." No epistaxis on arrival to the ED. Pt herself denies any complaints.   Past Medical History  Diagnosis Date  . Hypertension   . Dementia   . Glaucoma   . Hip fracture, left   . Cellulitis   . Anemia   . Left leg DVT   . Anxiety   . Insomnia   . Arthritis   . Chronic ear infection   . 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 Hip fracture    Past Surgical History  Procedure Laterality Date  . Orif hip fracture  05/23/2011    Procedure: OPEN REDUCTION INTERNAL FIXATION HIP;  Surgeon: Fuller CanadaStanley Harrison, MD;  Location: [facility];  Service: Orthopedics;  Laterality: Left;  with Gamma Nail  . Hip arthrotomy    . Abdominal hysterectomy     History reviewed. No pertinent family history. History  Substance Use Topics  . Smoking status: Never Smoker   . Smokeless tobacco: Never Used  . Alcohol Use: No    Review of Systems  Unable to perform ROS: Dementia  HENT: Positive for nosebleeds.       Allergies  Review of patient's allergies indicates no known allergies.  Home Medications   Prior to Admission medications   Medication Sig Start Date End Date Taking? Authorizing Provider  aspirin EC 81 MG tablet Take 81 mg by mouth daily.    Historical Provider, MD  ASPIRIN LOW DOSE 81 MG EC tablet TAKE 1 TABLET BY MOUTH ONCE DAILY. Patient not taking: Reported on  08/10/2014 09/30/13   Babs SciaraScott A Luking, MD  bimatoprost (LUMIGAN) 0.01 % SOLN Place 1 drop into both eyes at bedtime.    Historical Provider, MD  citalopram (CELEXA) 10 MG tablet TAKE (1) TABLET BY MOUTH EACH MORNING. Patient not taking: Reported on 08/10/2014    Merlyn AlbertWilliam S Luking, MD  citalopram (CELEXA) 10 MG tablet Take 10 mg by mouth daily.    Historical Provider, MD  Cranberry 425 MG CAPS TAKE 1 CAPSULE BY MOUTH ONCE DAILY. Patient not taking: Reported on 08/10/2014 01/05/14   Babs SciaraScott A Luking, MD  Cranberry 425 MG CAPS Take 425 mg by mouth daily.    Historical Provider, MD  docusate sodium (COLACE) 100 MG capsule Take 100 mg by mouth daily.    Historical Provider, MD  GLYCERIN ADULT 2 G suppository INSERT 1 SUPPOSITORY RECTALLY ONCE DAILY AS NEEDED FOR BOWEL MOVEMENT. Patient not taking: Reported on 08/10/2014 04/04/14   Merlyn AlbertWilliam S Luking, MD  lisinopril (PRINIVIL,ZESTRIL) 5 MG tablet TAKE 1 TABLET BY MOUTH EVERY MORNING. Patient not taking: Reported on 08/10/2014 01/03/14   Merlyn AlbertWilliam S Luking, MD  lisinopril (PRINIVIL,ZESTRIL) 5 MG tablet Take 5 mg by mouth daily.    Historical Provider, MD  loratadine (CLARITIN) 10 MG tablet Take 10 mg by  mouth daily.    Historical Provider, MD  LORazepam (ATIVAN) 1 MG tablet Take 0.5 tablets (0.5 mg total) by mouth every 8 (eight) hours as needed for anxiety. Patient not taking: Reported on 08/10/2014 07/23/13   Bethann Berkshire, MD  LUMIGAN 0.01 % SOLN INSTILL 1 DROP INTO EACH EYE AT BEDTIME. Patient not taking: Reported on 08/10/2014 02/17/14   Babs Sciara, MD  Multiple Vitamin (DAILY VITE PO) Take 1 tablet by mouth daily.    Historical Provider, MD  Multiple Vitamin (DAILY VITE) TABS TAKE 1 TABLET BY MOUTH ONCE DAILY. Patient not taking: Reported on 08/10/2014 05/29/14   Merlyn Albert, MD  OLANZapine (ZYPREXA) 2.5 MG tablet Take 2.5 mg by mouth at bedtime.    Historical Provider, MD  OLANZapine (ZYPREXA) 5 MG tablet Take 5 mg by mouth at bedtime.    Historical  Provider, MD  ondansetron (ZOFRAN ODT) 4 MG disintegrating tablet  ODT q4 hours prn nausea/vomit 12/09/14   Blane Ohara, MD  Polyethyl Glycol-Propyl Glycol (SYSTANE OP) Place 1 drop into both eyes 4 (four) times daily.    Historical Provider, MD  polyethylene glycol (MIRALAX / GLYCOLAX) packet Take 17 g by mouth daily.    Historical Provider, MD  polyethylene glycol powder (GLYCOLAX/MIRALAX) powder MIX 1 CAPFUL (17 G) IN 8 OUNCES OF JUICE/WATER AND DRINK ONCE DAILY. Patient not taking: Reported on 08/10/2014 06/22/13   Merlyn Albert, MD  Q-PAP 500 MG tablet TAKE 2 TABLETS BY MOUTH EVERY 4 HOURS AS NEEDED FOR PAIN. NO MORE THAN 2 DOSES IN 24 HOURS. Patient not taking: Reported on 08/10/2014 11/14/13   Merlyn Albert, MD  QC NASAL RELIEF SINUS 0.05 % nasal spray INHALE 2 SPRAYS INTO EACH NOSTRIL EVERY 6 HOURS AS NEEDED FOR NASAL IRRITATION. Patient not taking: Reported on 08/10/2014 09/30/13   Babs Sciara, MD  senna (SENOKOT) 8.6 MG TABS tablet Take 1 tablet by mouth daily.    Historical Provider, MD  SENNALAX-S 8.6-50 MG per tablet TAKE 1 TABLET BY MOUTH ONCE DAILY AS NEEDED FOR CONSTIPATION. Patient not taking: Reported on 08/10/2014 04/11/14   Merlyn Albert, MD  STOOL SOFTENER 100 MG capsule TAKE 1 CAPSULE BY MOUTH ONCE DAILY. Patient not taking: Reported on 08/10/2014 10/31/13   Merlyn Albert, MD   BP 177/63 mmHg  Pulse 61  Temp(Src) 97.7 F (36.5 C) (Oral)  Resp 16  Ht  (1.575 m)  SpO2 98% Physical Exam  0755: Physical examination:  Nursing notes reviewed; Vital signs and O2 SAT reviewed;  Constitutional: Well developed, Well nourished, Well hydrated, In no acute distress; Head:  Normocephalic, atraumatic; Eyes: EOMI, PERRL, No scleral icterus; ENMT: Tm's clear bilat. +anterior left nares with dried blood. No active epistaxis. No blood in posterior pharynx. Mouth and pharynx normal, Mucous membranes moist; Neck: Supple, Full range of motion, No lymphadenopathy;  Cardiovascular: Regular rate and rhythm, No gallop; Respiratory: Breath sounds clear & equal bilaterally, No wheezes.  Speaking full sentences with ease, Normal respiratory effort/excursion; Chest: Nontender, Movement normal; Abdomen: Soft, Nontender, Nondistended, Normal bowel sounds; Genitourinary: No CVA tenderness; Extremities: Pulses normal, No tenderness, No edema, No calf edema or asymmetry.; Neuro: Awake, alert, confused per hx dementia. Major CN grossly intact.  Speech clear. No gross focal motor or sensory deficits in extremities.; Skin: Color normal, Warm, Dry.   ED Course  Procedures     EKG Interpretation None      MDM  MDM Reviewed: previous chart, nursing note and  vitals      0845:  No further epistaxis while in the ED. Daughter would like pt transported back to the NH. Will d/c back to NH stable.   Samuel Jester, DO 12/19/14 1628

## 2014-12-16 NOTE — Discharge Instructions (Signed)
°Emergency Department Resource Guide °1) Find a Doctor and Pay Out of Pocket °Although you won't have to find out who is covered by your insurance plan, it is a good idea to ask around and get recommendations. You will then need to call the office and see if the doctor you have chosen will accept you as a new patient and what types of options they offer for patients who are self-pay. Some doctors offer discounts or will set up payment plans for their patients who do not have insurance, but you will need to ask so you aren't surprised when you get to your appointment. ° °2) Contact Your Local Health Department °Not all health departments have doctors that can see patients for sick visits, but many do, so it is worth a call to see if yours does. If you don't know where your local health department is, you can check in your phone book. The CDC also has a tool to help you locate your state's health department, and many state websites also have listings of all of their local health departments. ° °3) Find a Walk-in Clinic °If your illness is not likely to be very severe or complicated, you may want to try a walk in clinic. These are popping up all over the country in pharmacies, drugstores, and shopping centers. They're usually staffed by nurse practitioners or physician assistants that have been trained to treat common illnesses and complaints. They're usually fairly quick and inexpensive. However, if you have serious medical issues or chronic medical problems, these are probably not your best option. ° °No Primary Care Doctor: °- Call Health Connect at  832-8000 - they can help you locate a primary care doctor that  accepts your insurance, provides certain services, etc. °- Physician Referral Service- 1-800-533-3463 ° °Chronic Pain Problems: °Organization         Address  Phone   Notes  °[facility]  (336) [phone] Patients need to be referred by their primary care doctor.  ° °Medication  Assistance: °Organization         Address  Phone   Notes  °[place] [facility] [address] °[place], [place] [address] (336) 641-8030 --Must be a resident of [place] °-- Must have NO insurance coverage whatsoever (no Medicaid/ Medicare, etc.) °-- The pt. MUST have a primary care doctor that directs their care regularly and follows them in the community °  °[facility]  (866) 331-1348   °[employer]  (888) 892-1162   ° °Agencies that provide inexpensive medical care: °Organization         Address  Phone   Notes  °[facility]  (336) 832-8035   °[facility]    (336) 832-7272   °[facility] [address] °[place], [place] [address] (336) 832-4777   °[facility] [address], °[place] (336) 271-4999   °[facility]    (336) 373-0678   °[facility]    (336) 272-1050   °[facility] ° [address], [place] Phone:  (336) 832-4444, Fax:  (336) 832-4440 Hours of Operation:  9 am - 6 pm, M-F.  Also accepts Medicaid/Medicare and self-pay.  °[facility] ° [address], [place] Phone: (336) [phone], Fax: (336) [phone]. Hours of Operation:  8:30 am - 5:30 pm, M-F.  Also accepts Medicaid and self-pay.  °[facility] [place] [address], [place] Phone: (336) 878-6027   °[facility] [address], [place], [place] (336)723-1848, Ext. 123 Mondays & Thursdays: 7-9 AM.  First 15 patients are seen on a first come, first serve basis. °  ° °Medicaid-accepting [place] Providers: ° °Organization         Address  Phone   Notes  °[facility] [address], [place] (336) 641-2100 Also accepts self-pay patients.  °[facility] [address], [place] ° (336) 856-9996   °[facility] [address], [place]  (336) 288-8857   °[facility] [address] [place] [address], [place] (336) 299-7000   °Veita Bland [address], [place]  ° (336) 373-1557 Only accepts [place] Access Medicaid patients after they have their name applied to their card.  ° °Self-Pay (no insurance) in [place]: ° °Organization         Address  Phone   Notes  °Sickle Cell Patients, [facility] [address], [place] (336) 832-1970   °[facility] [address], [place] (336) 832-4400   °[facility] ° [address], [place] (336) 992-4800   °[facility]/Dr. Osei-Bonsu ° [address] [place] [address], [place] or [address], [place] (336) [phone] Phone number for both [place] and [place] locations is the same.  °[facility] [address], [place] (336) 299-0000   °[facility] [address] [place] [address], [place] or [address] (336) 852-7530 °(336) 878-2260   °[facility] [address], [place] (336) 350-1642, phone; (336) 294-5005, fax Sees patients 1st and 3rd Saturday of every month.  Must not qualify for public or private insurance (i.e. Medicaid, Medicare, [place] Health Choice, Veterans' Benefits) • Household income should be no more than 200% of the poverty level •The clinic cannot treat you if you are pregnant or think you are pregnant • Sexually transmitted diseases are not treated at the clinic.  ° ° °Dental Care: °Organization         Address  Phone  Notes  °[place] [facility] [address], [place] (336) 641-6152 Accepts children up to age 21 who are enrolled in Medicaid or [place] Health Choice; pregnant women with a Medicaid card; and children who have applied for Medicaid or [place] Health Choice, but were declined, whose parents can pay a reduced fee at time of service.  °[place]  [facility] [place]  [address], [place] (336) 641-7733 Accepts children up to age 21 who are enrolled in Medicaid or [place] Health Choice; pregnant women with a Medicaid card; and children who have applied for Medicaid or [place] Health Choice, but were declined, whose parents can pay a reduced fee at time of service.  °[facility] ° [address], [place] (336) 641-4533 Patients are seen by appointment only. Walk-ins are not accepted. [facility] will see patients 18 years of age and older. °Monday - Tuesday (8am-5pm) °Most Wednesdays (8:30-5pm) °$30 per visit, cash only  °[facility] ° [address], [place] (336) 641-4533 Patients are seen by appointment only. Walk-ins are not accepted. [facility] will see patients 18 years of age and older. °One   Wednesday Evening (Monthly: Volunteer Based).  $30 per visit, cash only  °[facility]  (919) 537-3737 for adults; Children under age 4, call Graduate Pediatric Dentistry at ([phone]) [phone]. Children aged 4-14, please call (919) [phone] to request a pediatric application. ° Dental services are provided in all areas of dental care including fillings, crowns and bridges, complete and partial dentures, implants, gum treatment, root canals, and extractions. Preventive care is also provided. Treatment is provided to both adults and children. °Patients are selected via a lottery and there is often a waiting list. °  °[facility] [address], °[place] ° (336) 763-8833 [email or web address] °  °[facility] [address], [place], [place] (336)723-1848, Ext. 123 Second and Fourth Thursday of each month, opens at 6:30 AM; Clinic ends at 9 AM.  Patients are seen on a first-come first-served basis, and a limited number are seen during each clinic.  ° °[facility] ° [address], [place], [place] (336) 723-7904    Eligibility Requirements °You must have lived in [place], [place], or [place] counties for at least the last three months. °  You cannot be eligible for state or federal sponsored healthcare insurance, including Veterans Administration, Medicaid, or Medicare. °  You generally cannot be eligible for healthcare insurance through your employer.  °  How to apply: °Eligibility screenings are held every Tuesday and Wednesday afternoon from 1:00 pm until 4:00 pm. You do not need an appointment for the interview!  °[facility] [address], [place], [place] 336-631-2330   °[place]  336-342-8273   °[facility]  336-703-3100   °[facility]  336-570-6415   ° °Behavioral Health Resources in the Community: °Intensive Outpatient Programs °Organization         Address  Phone  Notes  °[facility] [address], [place], [place] 336-878-6098   °[facility] [address], [place], [place] 336-832-9800   °[facility] [address], [place], [place] ° 336-882-2125   °[facility] [address],  °[place], [place] 1-800-853-5163 or 336-641-4981   °Substance Abuse Resources °Organization         Address  Phone  Notes  °[facility]  336-882-2125   °[facility]  336-784-9470   °[facility]  336-285-9073   °[facility]  336-845-3988   °Residential & Outpatient Substance Abuse Program  1-800-659-3381   °Psychological Services °Organization         Address  Phone  Notes  °[facility]  336- 832-9600   °[facility]  336- 378-7881   °[facility] [address], [place] 1-800-853-5163 or 336-641-4981   ° °Mobile Crisis Teams °Organization         Address  Phone  Notes  °[facility], Mobile Crisis Care Unit  1-877-626-1772   °Assertive °[facility] ° [address]  [place], [place] 336-834-9664   °Sharon DeEsch [address] °[place] 336-554-5454   ° °Self-Help/Support Groups °Organization         Address  Phone             Notes  °[facility] of  - variety of support groups  336- 373-1402 Call for more information  °Narcotics Anonymous (NA), [facility] [address], °[place]  2 meetings at this location  ° °  Residential Treatment Programs Organization         Address  Phone  Notes  [facility] [address],    [place]  [phone]   [facility]  [address], [place] [phone], [place], [place] [phone]   [facility] [address], [place] [phone] Admissions: 8am-3pm M-F  [facility] [address]    [place], [place] [phone]   [facility] [address], [place], [place] [phone]   [facility] [address]  [place], [place] [phone]   [facility] Intensive Outpatient [address], [place], [place] [phone]   [facility] ([facility] [address]  [place], [place] [phone]) 213-2582 or [phone]   [facility] ([facility]) [address] [place], [place] 322-025-4270(636)643-9251 Accepts Medicaid  [facility] [address]  [place] [phone] Substance Abuse/Addiction Treatment   [facility] Organization         Address  Phone  Notes  [facility]  [phone](888) (919)[phone]   Angie FavaJulie Brannon, PhD [address], [address] [place], [place]   ([phone](336) [phone] or [phone](336) ([phone]   [facility]   [address] [place], [place] [phone](336) [phone]   [facility] [address], [place], [place] [phone](336) [phone] Insurance/Medicaid/sponsorship through [facility] [address]                                    [place], [place] [phone](336) [phone] Therapy/tele-psych/case    [facility] [address]   [place], [place] [phone](336) [phone]    Dr. Lolly MustacheArfeen  [phone](336) [phone]   [facility]  [employer] [facility] 1) [address], [place] 2) [address], [place] 3)  [address], [place] ([phone](336) [phone](336) [phone]  [phone](336) [phone]   [facility] (782) 652-8694(336) (412)240-8278 or 438-313-1410(336) [phone] (After Hours)      Take your usual prescriptions as previously directed.  Use over the counter normal saline nasal spray, as directed on packaging, at least twice a day for the next week.  If you have a nosebleed:  Blow your nose gently, then lean your head forward and pinch both of your nostrils firmly and continuously for at least 15 to 20 minutes.  Call your regular medical doctor and the ENT doctor on Monday to schedule a follow up appointment this week.  Return to the Emergency Department immediately sooner if worsening.

## 2014-12-16 NOTE — ED Notes (Signed)
Patient with no complaints at this time. Respirations even and unlabored. Skin warm/dry. Discharge instructions reviewed with patient's LPN at this time. Patient's LPN given opportunity to voice concerns/ask questions. Patient discharged at this time and left Emergency Department via wheelchair with [facility].

## 2015-01-11 ENCOUNTER — Ambulatory Visit (INDEPENDENT_AMBULATORY_CARE_PROVIDER_SITE_OTHER): Payer: Medicare Other | Admitting: Otolaryngology

## 2015-01-11 DIAGNOSIS — H6043 Cholesteatoma of external ear, bilateral: Secondary | ICD-10-CM | POA: Diagnosis not present

## 2015-01-11 DIAGNOSIS — H6122 Impacted cerumen, left ear: Secondary | ICD-10-CM | POA: Diagnosis not present

## 2015-07-01 ENCOUNTER — Emergency Department (HOSPITAL_COMMUNITY)
Admission: EM | Admit: 2015-07-01 | Discharge: 2015-07-01 | Disposition: A | Discharge status: Home / Self Care | Payer: Medicare Other | Attending: Emergency Medicine | Admitting: Emergency Medicine

## 2015-07-01 ENCOUNTER — Emergency Department (HOSPITAL_COMMUNITY): Payer: Medicare Other

## 2015-07-01 ENCOUNTER — Encounter (HOSPITAL_COMMUNITY): Payer: Self-pay

## 2015-07-01 DIAGNOSIS — Z862 Personal history of diseases of the blood and blood-forming organs and certain disorders involving the immune mechanism: Secondary | ICD-10-CM | POA: Insufficient documentation

## 2015-07-01 DIAGNOSIS — Z79899 Other long term (current) drug therapy: Secondary | ICD-10-CM | POA: Diagnosis not present

## 2015-07-01 DIAGNOSIS — F0390 Unspecified dementia without behavioral disturbance: Secondary | ICD-10-CM | POA: Insufficient documentation

## 2015-07-01 DIAGNOSIS — R197 Diarrhea, unspecified: Secondary | ICD-10-CM | POA: Diagnosis not present

## 2015-07-01 DIAGNOSIS — G4700 Insomnia, unspecified: Secondary | ICD-10-CM | POA: Diagnosis not present

## 2015-07-01 DIAGNOSIS — Z872 Personal history of diseases of the skin and subcutaneous tissue: Secondary | ICD-10-CM | POA: Diagnosis not present

## 2015-07-01 DIAGNOSIS — M1990 Unspecified osteoarthritis, unspecified site: Secondary | ICD-10-CM | POA: Insufficient documentation

## 2015-07-01 DIAGNOSIS — I10 Essential (primary) hypertension: Secondary | ICD-10-CM | POA: Diagnosis not present

## 2015-07-01 DIAGNOSIS — R4182 Altered mental status, unspecified: Secondary | ICD-10-CM | POA: Diagnosis present

## 2015-07-01 DIAGNOSIS — Z86718 Personal history of other venous thrombosis and embolism: Secondary | ICD-10-CM | POA: Insufficient documentation

## 2015-07-01 DIAGNOSIS — F419 Anxiety disorder, unspecified: Secondary | ICD-10-CM | POA: Insufficient documentation

## 2015-07-01 DIAGNOSIS — Z7982 Long term (current) use of aspirin: Secondary | ICD-10-CM | POA: Insufficient documentation

## 2015-07-01 DIAGNOSIS — H409 Unspecified glaucoma: Secondary | ICD-10-CM | POA: Insufficient documentation

## 2015-07-01 LAB — CBC WITH DIFFERENTIAL/PLATELET
Basophils Absolute: 0 10*3/uL (ref 0.0–0.1)
Basophils Relative: 0 %
EOS ABS: 0.1 10*3/uL (ref 0.0–0.7)
Eosinophils Relative: 1 %
HCT: 31.7 % — ABNORMAL LOW (ref 36.0–46.0)
Hemoglobin: 10.2 g/dL — ABNORMAL LOW (ref 12.0–15.0)
LYMPHS PCT: 26 %
Lymphs Abs: 1.5 10*3/uL (ref 0.7–4.0)
MCH: 32.3 pg (ref 26.0–34.0)
MCHC: 32.2 g/dL (ref 30.0–36.0)
MCV: 100.3 fL — AB (ref 78.0–100.0)
MONO ABS: 0.4 10*3/uL (ref 0.1–1.0)
Monocytes Relative: 6 %
Neutro Abs: 3.8 10*3/uL (ref 1.7–7.7)
Neutrophils Relative %: 67 %
Platelets: 248 10*3/uL (ref 150–400)
RBC: 3.16 MIL/uL — ABNORMAL LOW (ref 3.87–5.11)
RDW: 14 % (ref 11.5–15.5)
WBC: 5.7 10*3/uL (ref 4.0–10.5)

## 2015-07-01 LAB — COMPREHENSIVE METABOLIC PANEL
ALK PHOS: 52 U/L (ref 38–126)
ALT: 12 U/L — AB (ref 14–54)
AST: 24 U/L (ref 15–41)
Albumin: 3.6 g/dL (ref 3.5–5.0)
Anion gap: 9 (ref 5–15)
BILIRUBIN TOTAL: 0.5 mg/dL (ref 0.3–1.2)
BUN: 25 mg/dL — AB (ref 6–20)
CALCIUM: 8.7 mg/dL — AB (ref 8.9–10.3)
CO2: 24 mmol/L (ref 22–32)
CREATININE: 1.46 mg/dL — AB (ref 0.44–1.00)
Chloride: 105 mmol/L (ref 101–111)
GFR calc Af Amer: 33 mL/min — ABNORMAL LOW (ref >60)
GFR, EST NON AFRICAN AMERICAN: 28 mL/min — AB (ref >60)
Glucose, Bld: 155 mg/dL — ABNORMAL HIGH (ref 65–99)
POTASSIUM: 4.3 mmol/L (ref 3.5–5.1)
Sodium: 138 mmol/L (ref 135–145)
TOTAL PROTEIN: 8 g/dL (ref 6.5–8.1)

## 2015-07-01 LAB — CBG MONITORING, ED: GLUCOSE-CAPILLARY: 147 mg/dL — AB (ref 65–99)

## 2015-07-01 LAB — URINALYSIS, ROUTINE W REFLEX MICROSCOPIC
Bilirubin Urine: NEGATIVE
GLUCOSE, UA: NEGATIVE mg/dL
HGB URINE DIPSTICK: NEGATIVE
KETONES UR: NEGATIVE mg/dL
Leukocytes, UA: NEGATIVE
Nitrite: NEGATIVE
PROTEIN: NEGATIVE mg/dL
Specific Gravity, Urine: 1.025 (ref 1.005–1.030)
Urobilinogen, UA: 0.2 mg/dL (ref 0.0–1.0)
pH: 6 (ref 5.0–8.0)

## 2015-07-01 LAB — POC OCCULT BLOOD, ED: Fecal Occult Bld: POSITIVE — AB

## 2015-07-01 MED ORDER — NALOXONE HCL 1 MG/ML IJ SOLN
1.0000 mg | Freq: Once | INTRAMUSCULAR | Status: AC
  Administered 2015-07-01: 1 mg via INTRAVENOUS
  Filled 2015-07-01: qty 2

## 2015-07-01 MED ORDER — SODIUM CHLORIDE 0.9 % IV BOLUS (SEPSIS)
500.0000 mL | Freq: Once | INTRAVENOUS | Status: AC
  Administered 2015-07-01: 500 mL via INTRAVENOUS

## 2015-07-01 NOTE — Discharge Instructions (Signed)
Drink plenty of fluids and follow up with your md if problems

## 2015-07-01 NOTE — ED Notes (Signed)
EMS reports pt resident of [facility] and staff found pt unresponsive sitting on the commode.  Reports had a loose stool in the commode.  EMS reports pt responsive to painful stimuli.  02 sat 93% on room air, increased to 97% on NRB   CBG 170.  Upon arrival pt able to sit up in bed and looking around the room.

## 2015-07-01 NOTE — ED Provider Notes (Signed)
CSN: [id number]     Arrival date & time 07/01/15  0940 History  By signing my name below, I, Murriel Hopper, attest that this documentation has been prepared under the direction and in the presence of Bethann Berkshire, MD. Electronically Signed: Murriel Hopper, ED Scribe. 07/01/2015. 10:06 AM.    Chief Complaint  Patient presents with  . Altered Mental Status   LEVEL 5 CAVEAT   Patient is a 81 y.o. female presenting with altered mental status. The history is provided by the patient and the EMS personnel. No language interpreter was used.  Altered Mental Status Presenting symptoms: lethargy and unresponsiveness   Severity:  Moderate Most recent episode:  Today Duration:  1 hour Timing:  Constant Chronicity:  New Context: nursing home resident   Associated symptoms: no abdominal pain, no hallucinations, no headaches, no rash and no seizures    HPI Comments: Wendy Coleman is a 81 y.o. female brought in by EMS who presents to the Emergency Department for being unresponsive after using the bathroom this morning. Per EMS the staff at her nursing home found her unresponsive sitting on the toilet after she had a loose stool in the bathroom. EMS reports pt responsive to painful stimuli.   Past Medical History  Diagnosis Date  . Hypertension   . Dementia   . Glaucoma   . Hip fracture, left (HCC)   . Cellulitis   . Anemia   . Left leg DVT (HCC)   . Anxiety   . Insomnia   . Arthritis   . Chronic ear infection   . 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 Hip fracture [facility])    Past Surgical History  Procedure Laterality Date  . Orif hip fracture  05/23/2011    Procedure: OPEN REDUCTION INTERNAL FIXATION HIP;  Surgeon: Fuller Canada, MD;  Location: [facility];  Service: Orthopedics;  Laterality: Left;  with Gamma Nail  . Hip arthrotomy    . Abdominal hysterectomy     No family history on file. Social History  Substance Use Topics  . Smoking status: Never Smoker   .  Smokeless tobacco: Never Used  . Alcohol Use: No   OB History    Gravida Para Term Preterm AB TAB SAB Ectopic Multiple Living            0     Review of Systems  Constitutional: Negative for appetite change and fatigue.  HENT: Negative for congestion, ear discharge and sinus pressure.   Eyes: Negative for discharge.  Respiratory: Negative for cough.   Cardiovascular: Negative for chest pain.  Gastrointestinal: Negative for abdominal pain and diarrhea.  Genitourinary: Negative for frequency and hematuria.  Musculoskeletal: Negative for back pain.  Skin: Negative for rash.  Neurological: Negative for seizures and headaches.  Psychiatric/Behavioral: Negative for hallucinations.      Allergies  Review of patient's allergies indicates no known allergies.  Home Medications   Prior to Admission medications   Medication Sig Start Date End Date Taking? Authorizing Provider  aspirin EC 81 MG tablet Take 81 mg by mouth daily.    Historical Provider, MD  ASPIRIN LOW DOSE 81 MG EC tablet TAKE 1 TABLET BY MOUTH ONCE DAILY. Patient not taking: Reported on 08/10/2014 09/30/13   Babs Sciara, MD  bimatoprost (LUMIGAN) 0.01 % SOLN Place 1 drop into both eyes at bedtime.    Historical Provider, MD  citalopram (CELEXA) 10 MG tablet TAKE (1) TABLET BY MOUTH EACH MORNING.  Patient not taking: Reported on 08/10/2014    Merlyn Albert, MD  citalopram (CELEXA) 10 MG tablet Take 10 mg by mouth daily.    Historical Provider, MD  Cranberry 425 MG CAPS TAKE 1 CAPSULE BY MOUTH ONCE DAILY. Patient not taking: Reported on 08/10/2014 01/05/14   Babs Sciara, MD  Cranberry 425 MG CAPS Take 425 mg by mouth daily.    Historical Provider, MD  docusate sodium (COLACE) 100 MG capsule Take 100 mg by mouth daily.    Historical Provider, MD  GLYCERIN ADULT 2 G suppository INSERT 1 SUPPOSITORY RECTALLY ONCE DAILY AS NEEDED FOR BOWEL MOVEMENT. Patient not taking: Reported on 08/10/2014 04/04/14   Merlyn Albert, MD   lisinopril (PRINIVIL,ZESTRIL) 5 MG tablet TAKE 1 TABLET BY MOUTH EVERY MORNING. Patient not taking: Reported on 08/10/2014 01/03/14   Merlyn Albert, MD  lisinopril (PRINIVIL,ZESTRIL) 5 MG tablet Take 5 mg by mouth daily.    Historical Provider, MD  loratadine (CLARITIN) 10 MG tablet Take 10 mg by mouth daily.    Historical Provider, MD  LORazepam (ATIVAN) 1 MG tablet Take 0.5 tablets (0.5 mg total) by mouth every 8 (eight) hours as needed for anxiety. Patient not taking: Reported on 08/10/2014 07/23/13   Bethann Berkshire, MD  LUMIGAN 0.01 % SOLN INSTILL 1 DROP INTO EACH EYE AT BEDTIME. Patient not taking: Reported on 08/10/2014 02/17/14   Babs Sciara, MD  Multiple Vitamin (DAILY VITE PO) Take 1 tablet by mouth daily.    Historical Provider, MD  Multiple Vitamin (DAILY VITE) TABS TAKE 1 TABLET BY MOUTH ONCE DAILY. Patient not taking: Reported on 08/10/2014 05/29/14   Merlyn Albert, MD  OLANZapine (ZYPREXA) 2.5 MG tablet Take 2.5 mg by mouth at bedtime.    Historical Provider, MD  OLANZapine (ZYPREXA) 5 MG tablet Take 5 mg by mouth at bedtime.    Historical Provider, MD  ondansetron (ZOFRAN ODT) 4 MG disintegrating tablet  ODT q4 hours prn nausea/vomit 12/09/14   Blane Ohara, MD  Polyethyl Glycol-Propyl Glycol (SYSTANE OP) Place 1 drop into both eyes 4 (four) times daily.    Historical Provider, MD  polyethylene glycol (MIRALAX / GLYCOLAX) packet Take 17 g by mouth daily.    Historical Provider, MD  polyethylene glycol powder (GLYCOLAX/MIRALAX) powder MIX 1 CAPFUL (17 G) IN 8 OUNCES OF JUICE/WATER AND DRINK ONCE DAILY. Patient not taking: Reported on 08/10/2014 06/22/13   Merlyn Albert, MD  Q-PAP 500 MG tablet TAKE 2 TABLETS BY MOUTH EVERY 4 HOURS AS NEEDED FOR PAIN. NO MORE THAN 2 DOSES IN 24 HOURS. Patient not taking: Reported on 08/10/2014 11/14/13   Merlyn Albert, MD  QC NASAL RELIEF SINUS 0.05 % nasal spray INHALE 2 SPRAYS INTO EACH NOSTRIL EVERY 6 HOURS AS NEEDED FOR NASAL  IRRITATION. Patient not taking: Reported on 08/10/2014 09/30/13   Babs Sciara, MD  senna (SENOKOT) 8.6 MG TABS tablet Take 1 tablet by mouth daily.    Historical Provider, MD  SENNALAX-S 8.6-50 MG per tablet TAKE 1 TABLET BY MOUTH ONCE DAILY AS NEEDED FOR CONSTIPATION. Patient not taking: Reported on 08/10/2014 04/11/14   Merlyn Albert, MD  STOOL SOFTENER 100 MG capsule TAKE 1 CAPSULE BY MOUTH ONCE DAILY. Patient not taking: Reported on 08/10/2014 10/31/13   Merlyn Albert, MD   There were no vitals taken for this visit. Physical Exam  Constitutional: She is oriented to person, place, and time. She appears well-developed.  Lethargic  Will  awake to painful stimuli Answers few questions    HENT:  Head: Normocephalic.  Eyes: Conjunctivae and EOM are normal. No scleral icterus.  Pinpoint pupils  Neck: Neck supple. No thyromegaly present.  Cardiovascular: Normal rate and regular rhythm.  Exam reveals no gallop and no friction rub.   No murmur heard. Pulmonary/Chest: No stridor. She has no wheezes. She has no rales. She exhibits no tenderness.  Abdominal: She exhibits no distension. There is no tenderness. There is no rebound.  Musculoskeletal: Normal range of motion. She exhibits no edema.  Moves extremitites due to painful stimuli  Lymphadenopathy:    She has no cervical adenopathy.  Neurological: She is oriented to person, place, and time. She exhibits normal muscle tone. Coordination normal.  Skin: No rash noted. No erythema.  Psychiatric: She has a normal mood and affect. Her behavior is normal.    ED Course  Procedures (including critical care time)  DIAGNOSTIC STUDIES: Oxygen Saturation is 100% on room air, normal by my interpretation.    COORDINATION OF CARE: 10:04 AM Discussed treatment plan with pt at bedside and pt agreed to plan.   Labs Review Labs Reviewed - No data to display  Imaging Review No results found. I have personally reviewed and evaluated these  images and lab results as part of my medical decision-making.   EKG Interpretation   Date/Time:  Sunday July 01 2015 09:47:18 EDT Ventricular Rate:  66 PR Interval:  163 QRS Duration: 82 QT Interval:  433 QTC Calculation: 454 R Axis:   40 Text Interpretation:  Sinus rhythm Consider left atrial enlargement Low  voltage, precordial leads Borderline repolarization abnormality Confirmed  by Basha Krygier  MD, Rayburn Mundis ([id number]) on 07/01/2015 11:18:08 AM      MDM   Final diagnoses:  None    Patient had one bout of loose stool today and then became very weak and sleepy. She is back to her normal self according to the family. He also states she is a DO NOT RESUSCITATE and they prefer her to go back to the rest home. She has 24-hour care there. The patient is awake alert oriented to person. Labs were unremarkable. CT scan unremarkable. Patient will follow-up with family doctor.The chart was scribed for me under my direct supervision.  I personally performed the history, physical, and medical decision making and all procedures in the evaluation of this patient.Bethann Berkshire, MD 07/01/15 1128

## 2015-07-19 ENCOUNTER — Ambulatory Visit (INDEPENDENT_AMBULATORY_CARE_PROVIDER_SITE_OTHER): Payer: Medicare Other | Admitting: Otolaryngology

## 2015-07-19 DIAGNOSIS — H7112 Cholesteatoma of tympanum, left ear: Secondary | ICD-10-CM

## 2015-07-19 DIAGNOSIS — H6121 Impacted cerumen, right ear: Secondary | ICD-10-CM

## 2015-10-31 DEATH — deceased
